# Patient Record
Sex: Female | Born: 1965 | Race: Black or African American | Hispanic: No | Marital: Single | Smoking: Never smoker
Health system: Southern US, Community
[De-identification: ages and names within clinical notes are randomized; demographics above are authoritative.]

## PROBLEM LIST (undated history)

## (undated) DIAGNOSIS — E119 Type 2 diabetes mellitus without complications: Secondary | ICD-10-CM

## (undated) DIAGNOSIS — E785 Hyperlipidemia, unspecified: Secondary | ICD-10-CM

## (undated) DIAGNOSIS — I10 Essential (primary) hypertension: Secondary | ICD-10-CM

## (undated) HISTORY — PX: SHOULDER OPEN ROTATOR CUFF REPAIR: SHX2407

## (undated) HISTORY — DX: Type 2 diabetes mellitus without complications: E11.9

## (undated) HISTORY — DX: Essential (primary) hypertension: I10

## (undated) HISTORY — DX: Hyperlipidemia, unspecified: E78.5

## (undated) HISTORY — PX: REDUCTION MAMMAPLASTY: SUR839

## (undated) HISTORY — PX: BREAST REDUCTION SURGERY: SHX8

---

## 2000-03-11 ENCOUNTER — Other Ambulatory Visit: Admission: RE | Admit: 2000-03-11 | Discharge: 2000-03-11 | Payer: Self-pay | Admitting: Obstetrics and Gynecology

## 2001-02-04 ENCOUNTER — Encounter: Payer: Self-pay | Admitting: Otolaryngology

## 2001-02-04 ENCOUNTER — Encounter: Admission: RE | Admit: 2001-02-04 | Discharge: 2001-02-04 | Payer: Self-pay | Admitting: Otolaryngology

## 2001-03-11 ENCOUNTER — Other Ambulatory Visit: Admission: RE | Admit: 2001-03-11 | Discharge: 2001-03-11 | Payer: Self-pay | Admitting: Obstetrics and Gynecology

## 2002-03-09 ENCOUNTER — Encounter: Payer: Self-pay | Admitting: Otolaryngology

## 2002-03-09 ENCOUNTER — Encounter: Admission: RE | Admit: 2002-03-09 | Discharge: 2002-03-09 | Payer: Self-pay | Admitting: Family Medicine

## 2003-09-18 ENCOUNTER — Ambulatory Visit (HOSPITAL_COMMUNITY): Admission: RE | Admit: 2003-09-18 | Discharge: 2003-09-18 | Payer: Self-pay | Admitting: Specialist

## 2003-09-18 ENCOUNTER — Ambulatory Visit (HOSPITAL_BASED_OUTPATIENT_CLINIC_OR_DEPARTMENT_OTHER): Admission: RE | Admit: 2003-09-18 | Discharge: 2003-09-18 | Payer: Self-pay | Admitting: Specialist

## 2003-09-18 ENCOUNTER — Encounter (INDEPENDENT_AMBULATORY_CARE_PROVIDER_SITE_OTHER): Payer: Self-pay | Admitting: Specialist

## 2004-02-18 ENCOUNTER — Emergency Department (HOSPITAL_COMMUNITY): Admission: EM | Admit: 2004-02-18 | Discharge: 2004-02-18 | Payer: Self-pay | Admitting: Emergency Medicine

## 2005-10-23 ENCOUNTER — Other Ambulatory Visit: Admission: RE | Admit: 2005-10-23 | Discharge: 2005-10-23 | Payer: Self-pay | Admitting: Obstetrics and Gynecology

## 2005-12-07 ENCOUNTER — Emergency Department (HOSPITAL_COMMUNITY): Admission: EM | Admit: 2005-12-07 | Discharge: 2005-12-07 | Payer: Self-pay | Admitting: Emergency Medicine

## 2008-12-20 ENCOUNTER — Encounter: Admission: RE | Admit: 2008-12-20 | Discharge: 2008-12-20 | Payer: Self-pay | Admitting: Obstetrics and Gynecology

## 2010-02-17 ENCOUNTER — Encounter: Payer: Self-pay | Admitting: Family Medicine

## 2010-02-18 ENCOUNTER — Encounter: Payer: Self-pay | Admitting: Radiology

## 2010-02-18 ENCOUNTER — Encounter: Payer: Self-pay | Admitting: Family Medicine

## 2010-06-14 NOTE — Op Note (Signed)
Kathryn Rivera, Kathryn Rivera                           ACCOUNT NO.:  [id number]   MEDICAL RECORD NO.:  [id number]                   PATIENT TYPE:  AMB   LOCATION:  DSC                                  FACILITY:  [facility]   PHYSICIAN:  Earvin Hansen L. Shon Hough, M.D.           DATE OF BIRTH:  Jun 10, 1965   DATE OF PROCEDURE:  09/18/2003  DATE OF DISCHARGE:                                 OPERATIVE REPORT   A 45 year old lady with severe, severe macromastia.  Wears over a 50E-F bra.  Increased back and shoulder pain secondary to large pendulous breasts.  Intertriginous changes resistant to conservative treatment.   PROCEDURES PLANNED:  1. Bilateral breast reductions using the inferior pedicle technique.  2. Excision of accessory breast tissue, large amounts from right and left     axillary and latissimus dorsi areas.   ANESTHESIA:  General.   Preoperatively the patient was sat up and drawn for the inferior pedicle  reduction and mammoplasty.  She then underwent general anesthesia and was  intubated orally.  Prep was done to the chest and breasts areas in routine  fashion using Betadine soap and solution and walled off with sterile towels  and drapes so as to make a sterile field.  Xylocaine 0.25% with epinephrine  1:400,000 concentration was injected locally, a total of 150 mL per side.  The wounds were scored with #15 blade and the skin over the inferior pedicle  was de-epithelized with a #20 blade.  Medial and lateral fatty dermal  pedicles were incised down to underlying fascia out laterally.  More tissue  was removed, as well as large volumes of accessory breast tissue.  After  proper hemostasis, a new keyhole area was debulked and then the flap was  transposed and stayed with 3-0 Prolene.  After proper hemostasis, irrigation  was done showing good hemostasis.  The flaps were then closed subcutaneously  with 3-0 Monocryl x 2 layers and a running subcuticular stitch of 3-0  Monocryl and 5-0  Monocryl throughout the inverted T.  The wounds were  drained with #10 Blake drains fully fluted, placed in the depths of the  wound and brought out through the lateral most portion of the incision and  secured with 3-0 Prolene.  Steri-Strips and a soft dressing were applied,  including Xeroform, 4 x 4s, ABDs and Hypafix tape.  She withstood the  procedures very well, removing almost 2000 g per side.  She was then taken  to recovery in excellent condition.                                               Yaakov Guthrie. Shon Hough, M.D.    Cathie Hoops  D:  09/18/2003  T:  09/18/2003  Job:  [id number]

## 2011-08-25 ENCOUNTER — Other Ambulatory Visit: Payer: Self-pay | Admitting: Obstetrics and Gynecology

## 2011-08-25 DIAGNOSIS — Z139 Encounter for screening, unspecified: Secondary | ICD-10-CM

## 2011-09-11 ENCOUNTER — Ambulatory Visit
Admission: RE | Admit: 2011-09-11 | Discharge: 2011-09-11 | Disposition: A | Discharge status: Home / Self Care | Payer: No Typology Code available for payment source | Source: Ambulatory Visit | Attending: Obstetrics and Gynecology | Admitting: Obstetrics and Gynecology

## 2011-09-11 DIAGNOSIS — Z139 Encounter for screening, unspecified: Secondary | ICD-10-CM

## 2012-10-06 ENCOUNTER — Other Ambulatory Visit: Payer: Self-pay

## 2012-10-06 DIAGNOSIS — Z9889 Other specified postprocedural states: Secondary | ICD-10-CM

## 2012-10-06 DIAGNOSIS — Z1231 Encounter for screening mammogram for malignant neoplasm of breast: Secondary | ICD-10-CM

## 2012-10-29 ENCOUNTER — Ambulatory Visit
Admission: RE | Admit: 2012-10-29 | Discharge: 2012-10-29 | Disposition: A | Discharge status: Home / Self Care | Payer: PRIVATE HEALTH INSURANCE | Source: Ambulatory Visit

## 2012-10-29 DIAGNOSIS — Z9889 Other specified postprocedural states: Secondary | ICD-10-CM

## 2012-10-29 DIAGNOSIS — Z1231 Encounter for screening mammogram for malignant neoplasm of breast: Secondary | ICD-10-CM

## 2013-10-12 ENCOUNTER — Other Ambulatory Visit: Payer: Self-pay | Admitting: Interventional Cardiology

## 2013-10-12 DIAGNOSIS — M545 Low back pain, unspecified: Secondary | ICD-10-CM

## 2014-03-30 ENCOUNTER — Other Ambulatory Visit: Payer: Self-pay

## 2014-03-30 DIAGNOSIS — Z1231 Encounter for screening mammogram for malignant neoplasm of breast: Secondary | ICD-10-CM

## 2014-04-07 ENCOUNTER — Ambulatory Visit
Admission: RE | Admit: 2014-04-07 | Discharge: 2014-04-07 | Disposition: A | Discharge status: Home / Self Care | Payer: PRIVATE HEALTH INSURANCE | Source: Ambulatory Visit

## 2014-04-07 DIAGNOSIS — Z1231 Encounter for screening mammogram for malignant neoplasm of breast: Secondary | ICD-10-CM

## 2015-11-16 ENCOUNTER — Other Ambulatory Visit: Payer: Self-pay | Admitting: Family Medicine

## 2015-11-16 ENCOUNTER — Other Ambulatory Visit (HOSPITAL_COMMUNITY)
Admission: RE | Admit: 2015-11-16 | Discharge: 2015-11-16 | Disposition: A | Discharge status: Home / Self Care | Payer: PRIVATE HEALTH INSURANCE | Source: Ambulatory Visit | Attending: Family Medicine | Admitting: Family Medicine

## 2015-11-16 DIAGNOSIS — Z01411 Encounter for gynecological examination (general) (routine) with abnormal findings: Secondary | ICD-10-CM | POA: Diagnosis present

## 2015-11-16 DIAGNOSIS — Z1151 Encounter for screening for human papillomavirus (HPV): Secondary | ICD-10-CM | POA: Insufficient documentation

## 2015-11-19 LAB — CYTOLOGY - PAP
DIAGNOSIS: NEGATIVE
HPV: NOT DETECTED

## 2016-05-12 ENCOUNTER — Ambulatory Visit (INDEPENDENT_AMBULATORY_CARE_PROVIDER_SITE_OTHER): Payer: Self-pay | Admitting: Orthopedic Surgery

## 2016-05-20 ENCOUNTER — Other Ambulatory Visit: Payer: Self-pay | Admitting: Orthopaedic Surgery

## 2016-05-20 DIAGNOSIS — S8001XA Contusion of right knee, initial encounter: Secondary | ICD-10-CM

## 2016-05-21 ENCOUNTER — Ambulatory Visit
Admission: RE | Admit: 2016-05-21 | Discharge: 2016-05-21 | Disposition: A | Discharge status: Home / Self Care | Payer: PRIVATE HEALTH INSURANCE | Source: Ambulatory Visit | Attending: Orthopaedic Surgery | Admitting: Orthopaedic Surgery

## 2016-05-21 DIAGNOSIS — S8001XA Contusion of right knee, initial encounter: Secondary | ICD-10-CM

## 2016-06-02 ENCOUNTER — Telehealth (INDEPENDENT_AMBULATORY_CARE_PROVIDER_SITE_OTHER): Payer: Self-pay

## 2016-06-02 DIAGNOSIS — M25531 Pain in right wrist: Secondary | ICD-10-CM

## 2016-06-02 DIAGNOSIS — M79605 Pain in left leg: Secondary | ICD-10-CM

## 2016-06-02 NOTE — Telephone Encounter (Signed)
Ok for pre visit xrays

## 2016-06-02 NOTE — Telephone Encounter (Signed)
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  CB# is [phone].

## 2016-06-02 NOTE — Telephone Encounter (Signed)
Please advise. Do you feel that we should see patient first or do you want to proceed with ordering these xrays?

## 2016-06-03 ENCOUNTER — Ambulatory Visit (INDEPENDENT_AMBULATORY_CARE_PROVIDER_SITE_OTHER): Payer: Self-pay

## 2016-06-03 NOTE — Telephone Encounter (Signed)
Done. Patient aware.

## 2016-06-03 NOTE — Addendum Note (Signed)
Addended byPrescott Parma: Yonatan Guitron on: 06/03/2016 09:58 AM   Modules accepted: Orders

## 2016-06-05 ENCOUNTER — Other Ambulatory Visit: Payer: Self-pay | Admitting: Family Medicine

## 2016-06-05 DIAGNOSIS — Z1231 Encounter for screening mammogram for malignant neoplasm of breast: Secondary | ICD-10-CM

## 2016-06-06 ENCOUNTER — Other Ambulatory Visit (INDEPENDENT_AMBULATORY_CARE_PROVIDER_SITE_OTHER): Payer: Self-pay | Admitting: Orthopedic Surgery

## 2016-06-06 ENCOUNTER — Ambulatory Visit
Admission: RE | Admit: 2016-06-06 | Discharge: 2016-06-06 | Disposition: A | Discharge status: Home / Self Care | Payer: PRIVATE HEALTH INSURANCE | Source: Ambulatory Visit | Attending: Family Medicine | Admitting: Family Medicine

## 2016-06-06 ENCOUNTER — Ambulatory Visit
Admission: RE | Admit: 2016-06-06 | Discharge: 2016-06-06 | Disposition: A | Discharge status: Home / Self Care | Payer: PRIVATE HEALTH INSURANCE | Source: Ambulatory Visit | Attending: Orthopedic Surgery | Admitting: Orthopedic Surgery

## 2016-06-06 DIAGNOSIS — Z1231 Encounter for screening mammogram for malignant neoplasm of breast: Secondary | ICD-10-CM

## 2016-06-06 DIAGNOSIS — M25562 Pain in left knee: Secondary | ICD-10-CM

## 2016-06-06 DIAGNOSIS — M25531 Pain in right wrist: Secondary | ICD-10-CM

## 2016-06-09 ENCOUNTER — Ambulatory Visit (INDEPENDENT_AMBULATORY_CARE_PROVIDER_SITE_OTHER): Payer: PRIVATE HEALTH INSURANCE | Admitting: Orthopedic Surgery

## 2016-06-09 DIAGNOSIS — S83271A Complex tear of lateral meniscus, current injury, right knee, initial encounter: Secondary | ICD-10-CM

## 2016-06-09 DIAGNOSIS — M25562 Pain in left knee: Secondary | ICD-10-CM | POA: Diagnosis not present

## 2016-06-09 DIAGNOSIS — M25561 Pain in right knee: Secondary | ICD-10-CM

## 2016-06-09 DIAGNOSIS — M79642 Pain in left hand: Secondary | ICD-10-CM

## 2016-06-09 DIAGNOSIS — M79641 Pain in right hand: Secondary | ICD-10-CM

## 2016-06-11 ENCOUNTER — Encounter (INDEPENDENT_AMBULATORY_CARE_PROVIDER_SITE_OTHER): Payer: Self-pay | Admitting: Orthopedic Surgery

## 2016-06-11 DIAGNOSIS — M25562 Pain in left knee: Secondary | ICD-10-CM | POA: Diagnosis not present

## 2016-06-11 DIAGNOSIS — M25561 Pain in right knee: Secondary | ICD-10-CM | POA: Diagnosis not present

## 2016-06-11 MED ORDER — LIDOCAINE HCL 1 % IJ SOLN
5.0000 mL | INTRAMUSCULAR | Status: AC | PRN
  Administered 2016-06-11: 5 mL

## 2016-06-11 MED ORDER — BUPIVACAINE HCL 0.25 % IJ SOLN
4.0000 mL | INTRAMUSCULAR | Status: AC | PRN
  Administered 2016-06-11: 4 mL via INTRA_ARTICULAR

## 2016-06-11 MED ORDER — METHYLPREDNISOLONE ACETATE 40 MG/ML IJ SUSP
40.0000 mg | INTRAMUSCULAR | Status: AC | PRN
  Administered 2016-06-11: 40 mg via INTRA_ARTICULAR

## 2016-06-11 NOTE — Progress Notes (Signed)
Office Visit Note   Patient: Kathryn Rivera           Date of Birth: 01-28-1966           MRN: [id number] Visit Date: 06/09/2016 Requested by: Shirlean Mylar, MD [address] [place], [place] [address] PCP: Shirlean Mylar, MD  Subjective: Chief Complaint  Patient presents with  . Left Knee - Pain, Injury  . Left Wrist - Pain, Injury  . Left Hand - Pain, Injury    HPI: Kathryn Rivera is a 51 year old patient with bilateral knee pain right worse than left.  Date of injury 05/03/2016 where she fell down steps landing on her knees.  [facility] with [facility] where x-rays were obtained.  She was told to take an anti-inflammatory.  She has since had an MRI scan obtained.  Reports swelling in the knee which comes and goes along with popping and locking.  She fell again 3 weeks ago when her knee buckled and gave way.  MRI scan is reviewed and it shows potentially unstable lateral meniscal tear along with lateral compartment arthritis.  Anterior cruciate ligament is intact.  She also reports bilateral wrist and hand pain right worse than left.  She injured this when she fell and she is taking an anti-inflammatory for that.  She does live up 4 flights of stairs.  No family history or personal history of pulmonary embolism or deep vein thrombosis              ROS: All systems reviewed are negative as they relate to the chief complaint within the history of present illness.  Patient denies  fevers or chills.   Assessment & Plan: Visit Diagnoses:  1. Pain in both knees, unspecified chronicity   2. Pain in both hands     Plan: Impression is right knee pain with lateral meniscal tear in the face of arthritis with definite mechanical locking symptoms.  I reviewed the MRI scan with her.  I think in general her pain would be improved and her mechanical symptoms would be alleviated with partial lateral meniscectomy.  The tear itself does not appear to be repairable.   She will do this in late June.  I did aspirate and inject the knee today.  Risks and benefits of surgery are discussed including not limited to infection the stiffness incomplete pain relief as well as potential need for more surgery down the road which would be partial versus complete knee replacement.  All questions answered.  Her hand and wrist pain looks more like tendinitis and bruising as opposed to anything significant structural or operative  Follow-Up Instructions: No Follow-up on file.   Orders:  No orders of the defined types were placed in this encounter.  No orders of the defined types were placed in this encounter.     Procedures: Large Joint Inj Date/Time: 06/11/2016 8:05 AM Performed by: Cammy Copa Authorized by: Cammy Copa   Consent Given by:  Patient Site marked: the procedure site was marked   Timeout: prior to procedure the correct patient, procedure, and site was verified   Indications:  Pain, joint swelling and diagnostic evaluation Location:  Knee Site:  R knee Prep: patient was prepped and draped in usual sterile fashion   Needle Size:  18 G Needle Length:  1.5 inches Approach:  Superolateral Ultrasound Guidance: No   Fluoroscopic Guidance: No   Arthrogram: No   Medications:  5 mL  lidocaine 1 %; 4 mL bupivacaine 0.25 %; 40 mg methylPREDNISolone acetate 40 MG/ML Aspiration Attempted: Yes   Aspirate amount (mL):  20 Aspirate:  Blood-tinged Patient tolerance:  Patient tolerated the procedure well with no immediate complications     Clinical Data: No additional findings.  Objective: Vital Signs: LMP 05/29/2016 (Exact Date)   Physical Exam:   Constitutional: Patient appears well-developed HEENT:  Head: Normocephalic Eyes:EOM are normal Neck: Normal range of motion Cardiovascular: Normal rate Pulmonary/chest: Effort normal Neurologic: Patient is alert Skin: Skin is warm Psychiatric: Patient has normal mood and  affect    Ortho Exam: Orthopedic exam demonstrates slightly antalgic gait to the right with mild knee effusion the right no effusion in the left.  Collateral and cruciate ligaments are stable in both knees.  Extensor mechanism is intact.  Patient has palpable pedal pulses bilaterally with good ankle dorsi flexion plantar flexion strength.  No other masses lymph adenopathy or skin changes noted in the right knee region.  Both wrists are examined.  There is no snuffbox tenderness present.  No radial tuberosity/styloid tenderness.  Wrist range of motion is full.  Grip strength is intact bilaterally.  Pedal and radial pulses palpable.  She does have some tenderness over the EDC tendon but this does not subluxate with pronation supination of the wrist.  Specialty Comments:  No specialty comments available.  Imaging: No results found.   PMFS History: There are no active problems to display for this patient.  No past medical history on file.  Family History  Problem Relation Age of Onset  . Breast cancer Mother     No past surgical history on file. Social History   Occupational History  . Not on file.   Social History Main Topics  . Smoking status: Not on file  . Smokeless tobacco: Not on file  . Alcohol use Not on file  . Drug use: Unknown  . Sexual activity: Not on file

## 2016-06-19 ENCOUNTER — Ambulatory Visit (INDEPENDENT_AMBULATORY_CARE_PROVIDER_SITE_OTHER): Payer: PRIVATE HEALTH INSURANCE | Admitting: Orthopedic Surgery

## 2016-06-19 ENCOUNTER — Telehealth (INDEPENDENT_AMBULATORY_CARE_PROVIDER_SITE_OTHER): Payer: Self-pay

## 2016-06-19 ENCOUNTER — Telehealth (INDEPENDENT_AMBULATORY_CARE_PROVIDER_SITE_OTHER): Payer: Self-pay | Admitting: Orthopedic Surgery

## 2016-06-19 ENCOUNTER — Encounter (INDEPENDENT_AMBULATORY_CARE_PROVIDER_SITE_OTHER): Payer: Self-pay | Admitting: Orthopedic Surgery

## 2016-06-19 DIAGNOSIS — M25561 Pain in right knee: Secondary | ICD-10-CM | POA: Diagnosis not present

## 2016-06-19 NOTE — Telephone Encounter (Signed)
Per Dr August Saucerean he said can you please change Surgery date from June 11th to June 4th instead.  Thanks.

## 2016-06-19 NOTE — Telephone Encounter (Signed)
LVM with pt to please call to schedule surgery. Will try pt again at a later time. 

## 2016-06-21 DIAGNOSIS — M25561 Pain in right knee: Secondary | ICD-10-CM | POA: Diagnosis not present

## 2016-06-21 MED ORDER — LIDOCAINE HCL 1 % IJ SOLN
5.0000 mL | INTRAMUSCULAR | Status: AC | PRN
  Administered 2016-06-21: 5 mL

## 2016-06-21 MED ORDER — BUPIVACAINE HCL 0.25 % IJ SOLN
4.0000 mL | INTRAMUSCULAR | Status: AC | PRN
  Administered 2016-06-21: 4 mL via INTRA_ARTICULAR

## 2016-06-21 NOTE — Progress Notes (Signed)
Office Visit Note   Patient: Kathryn Rivera           Date of Birth: 1965-04-24           MRN: [id number] Visit Date: 06/19/2016 Requested by: Shirlean MylarWebb, Carol, MD [address] [place], [place] [address] PCP: Shirlean MylarWebb, Carol, MD  Subjective: Chief Complaint  Patient presents with  . Right Knee - Pain, Injury    HPI: Kathryn Rivera is a 51 year old female with right knee pain.  She had an injection on 06/09/2016.  She has no lateral meniscal tear and has been having some mechanical symptoms.  She is scheduled for surgery in the near future.  Since the injection her knee is locked up on 2 occasions.  This causes her extreme pain and inability to weight-bear.  Plan is to try to move her surgery date up.              ROS: All systems reviewed are negative as they relate to the chief complaint within the history of present illness.  Patient denies  fevers or chills.   Assessment & Plan: Visit Diagnoses:  1. Acute pain of right knee     Plan: Impression is mechanical right knee pain with likely locked meniscus bilaterally.  Plan is to inject that knee today with about 30 mL of Marcaine and Toradol to see if we can help her to unlock that knee.  Offered her surgical treatment prior to her surgery date that the best she can do is to move the surgery up to June 4.  We will try to do that to accommodate her.  Follow-Up Instructions: No Follow-up on file.   Orders:  No orders of the defined types were placed in this encounter.  No orders of the defined types were placed in this encounter.     Procedures: Large Joint Inj Date/Time: 06/21/2016 9:34 AM Performed by: Cammy CopaEAN, SCOTT Atley Neubert Authorized by: Cammy CopaEAN, SCOTT Breiona Couvillon   Consent Given by:  Patient Site marked: the procedure site was marked   Timeout: prior to procedure the correct patient, procedure, and site was verified   Indications:  Pain, joint swelling and diagnostic evaluation Location:  Knee Site:  R knee Prep:  patient was prepped and draped in usual sterile fashion   Needle Size:  18 G Needle Length:  1.5 inches Approach:  Superolateral Ultrasound Guidance: No   Fluoroscopic Guidance: No   Arthrogram: No   Medications:  5 mL lidocaine 1 %; 4 mL bupivacaine 0.25 % Patient tolerance:  Patient tolerated the procedure well with no immediate complications     Clinical Data: No additional findings.  Objective: Vital Signs: LMP 05/29/2016 (Exact Date)   Physical Exam:   Constitutional: Patient appears well-developed HEENT:  Head: Normocephalic Eyes:EOM are normal Neck: Normal range of motion Cardiovascular: Normal rate Pulmonary/chest: Effort normal Neurologic: Patient is alert Skin: Skin is warm Psychiatric: Patient has normal mood and affect    Ortho Exam: Orthopedic exam demonstrates loss of the last 15 of extension in the right knee.  Trace effusion is present.  It is primarily lateral sided joint line tenderness.  Flexion is past 90.  Extensor mechanism is intact.  Collateral crucial ligaments are stable.  Specialty Comments:  No specialty comments available.  Imaging: No results found.   PMFS History: There are no active problems to display for this patient.  No past medical history on file.  Family History  Problem Relation Age of Onset  .  Breast cancer Mother     No past surgical history on file. Social History   Occupational History  . Not on file.   Social History Main Topics  . Smoking status: Unknown If Ever Smoked  . Smokeless tobacco: Never Used  . Alcohol use Not on file  . Drug use: Unknown  . Sexual activity: Not on file

## 2016-06-30 ENCOUNTER — Encounter (INDEPENDENT_AMBULATORY_CARE_PROVIDER_SITE_OTHER): Payer: Self-pay | Admitting: Orthopedic Surgery

## 2016-06-30 ENCOUNTER — Ambulatory Visit (INDEPENDENT_AMBULATORY_CARE_PROVIDER_SITE_OTHER): Payer: PRIVATE HEALTH INSURANCE | Admitting: Orthopedic Surgery

## 2016-06-30 DIAGNOSIS — M25561 Pain in right knee: Principal | ICD-10-CM

## 2016-06-30 DIAGNOSIS — G8929 Other chronic pain: Secondary | ICD-10-CM

## 2016-07-02 DIAGNOSIS — M25561 Pain in right knee: Secondary | ICD-10-CM | POA: Diagnosis not present

## 2016-07-02 DIAGNOSIS — G8929 Other chronic pain: Secondary | ICD-10-CM

## 2016-07-02 MED ORDER — LIDOCAINE HCL 1 % IJ SOLN
5.0000 mL | INTRAMUSCULAR | Status: AC | PRN
  Administered 2016-07-02: 5 mL

## 2016-07-02 MED ORDER — BUPIVACAINE HCL 0.25 % IJ SOLN
4.0000 mL | INTRAMUSCULAR | Status: AC | PRN
  Administered 2016-07-02: 4 mL via INTRA_ARTICULAR

## 2016-07-02 NOTE — Progress Notes (Signed)
Office Visit Note   Patient: Kathryn Rivera           Date of Birth: 07-Apr-1965           MRN: [id number] Visit Date: 06/30/2016 Requested by: Shirlean MylarWebb, Carol, MD [address] [place], [place] [address] PCP: Shirlean MylarWebb, Carol, MD  Subjective: Chief Complaint  Patient presents with  . Right Knee - Follow-up, Pain    HPI: Kathryn Rivera is a 51 year old patient with right knee pain.  She states that her right knee locked again will she was out of town this past weekend.  She has a known complex lateral meniscal tear.  She is scheduled for surgery June 11.  She states that her knee locked up yesterday morning and she's been having difficulty moving it since that time.  She has been out of work this week due to the knee locking up.  This happened several weeks ago and a Marcaine and Toradol injection helped the knee to unlock.  Now it is locked again.              ROS: All systems reviewed are negative as they relate to the chief complaint within the history of present illness.  Patient denies  fevers or chills.   Assessment & Plan: Visit Diagnoses:  1. Chronic pain of right knee     Plan: Impression is right knee locked from likely complex lateral meniscal tear.  Plan is to inject that knee again today with 20 mL of Marcaine and 30 mg of Toradol.  We'll see if we can get her through until her surgery date on Monday.  Follow-Up Instructions: No Follow-up on file.   Orders:  No orders of the defined types were placed in this encounter.  No orders of the defined types were placed in this encounter.     Procedures: Large Joint Inj Date/Time: 07/02/2016 11:10 PM Performed by: Cammy CopaEAN, SCOTT Marisella Puccio Authorized by: Cammy CopaEAN, SCOTT Genetta Fiero   Consent Given by:  Patient Site marked: the procedure site was marked   Timeout: prior to procedure the correct patient, procedure, and site was verified   Indications:  Pain, joint swelling and diagnostic evaluation Location:  Knee Site:  R  knee Prep: patient was prepped and draped in usual sterile fashion   Needle Size:  18 G Needle Length:  1.5 inches Approach:  Superolateral Ultrasound Guidance: No   Fluoroscopic Guidance: No   Arthrogram: No   Medications:  5 mL lidocaine 1 %; 4 mL bupivacaine 0.25 % Aspiration Attempted: Yes   Aspirate amount (mL):  15 Aspirate:  Yellow Patient tolerance:  Patient tolerated the procedure well with no immediate complications     Clinical Data: No additional findings.  Objective: Vital Signs: There were no vitals taken for this visit.  Physical Exam:   Constituto right knee demonstrates mild effusion no extension beyond 15 of flexion.  Collateral cruciate ligaments are stable pedal pulses palpable no calf tenderness is present Patient appears well-developed HEENT:  Head: Normocephalic Eyes:EOM are normal Neck: Normal range of motion Cardiovascular: Normal rate Pulmonary/chest: Effort normal Neurologic: Patient is alert Skin: Skin is warm Psychiatric: Patient has normal mood and affect    Ortho Exam:   Specialty Comments:  No specialty comments available.  Imaging: No results found.   PMFS History: There are no active problems to display for this patient.  No past medical history on file.  Family History  Problem Relation Age of Onset  . Breast cancer  Mother     No past surgical history on file. Social History   Occupational History  . Not on file.   Social History Main Topics  . Smoking status: Unknown If Ever Smoked  . Smokeless tobacco: Never Used  . Alcohol use Not on file  . Drug use: Unknown  . Sexual activity: Not on file

## 2016-07-03 ENCOUNTER — Telehealth (INDEPENDENT_AMBULATORY_CARE_PROVIDER_SITE_OTHER): Payer: Self-pay

## 2016-07-03 MED ORDER — OXYCODONE HCL 5 MG PO CAPS: 5.0000 mg | ORAL_CAPSULE | Freq: Four times a day (QID) | ORAL | 0 refills | Status: DC | PRN

## 2016-07-03 NOTE — Addendum Note (Signed)
Addended byPrescott Parma: Vihan Santagata on: 07/03/2016 01:36 PM   Modules accepted: Orders

## 2016-07-03 NOTE — Telephone Encounter (Signed)
Patient called wanting to know if possible could her surgery be moved up or if she could get a Rx for pain until her surgery on Monday.  Stated that her right knee has locked up again and she is not able to move her leg and is having some swelling.  Please Advise.  CB# is [phone].

## 2016-07-03 NOTE — Telephone Encounter (Signed)
S/w Dr August Saucerean. He ok'd rx. I printed and patient may pick up at front desk. Patient is aware.

## 2016-07-03 NOTE — Telephone Encounter (Signed)
Called advised patient Dr August Saucerean not in office until this afternoon. Will ask him about this and call her back. She is asking for something very strong if she cannot have surgery sooner b/c she cannot lay down, she cannot straighten her leg and states it is extremely uncomfortable to sit in a chair.

## 2016-07-07 ENCOUNTER — Encounter (INDEPENDENT_AMBULATORY_CARE_PROVIDER_SITE_OTHER): Payer: Self-pay | Admitting: Orthopedic Surgery

## 2016-07-07 DIAGNOSIS — S83281D Other tear of lateral meniscus, current injury, right knee, subsequent encounter: Secondary | ICD-10-CM | POA: Diagnosis not present

## 2016-07-14 ENCOUNTER — Encounter (INDEPENDENT_AMBULATORY_CARE_PROVIDER_SITE_OTHER): Payer: Self-pay | Admitting: Orthopedic Surgery

## 2016-07-14 ENCOUNTER — Ambulatory Visit (INDEPENDENT_AMBULATORY_CARE_PROVIDER_SITE_OTHER): Payer: PRIVATE HEALTH INSURANCE | Admitting: Orthopedic Surgery

## 2016-07-14 DIAGNOSIS — S838X1D Sprain of other specified parts of right knee, subsequent encounter: Secondary | ICD-10-CM

## 2016-07-17 NOTE — Progress Notes (Signed)
   Post-Op Visit Note   Patient: Kathryn Rivera           Date of Birth: Aug 05, 1965           MRN: [id number] Visit Date: 07/14/2016 PCP: Shirlean MylarWebb, Carol, MD   Assessment & Plan:  Chief Complaint:  Chief Complaint  Patient presents with  . Right Knee - Routine Post Op   Visit Diagnoses: No diagnosis found.  Plan: Kathryn Rivera is a 51 year old patient who is now a week out right knee arthroscopy and debridement of a substantial portion of her lateral meniscus.  She's had no further locking symptoms.  On exam she has flexion past 90.  She is still on crutches.  On her to start some physical therapy and I'll see her back in 2 weeks.  May need to consider possible return to work at that time on a limited basis.  No calf tenderness today.  Therapy prescription provided  Follow-Up Instructions: Return in about 2 weeks (around 07/28/2016).   Orders:  No orders of the defined types were placed in this encounter.  No orders of the defined types were placed in this encounter.   Imaging: No results found.  PMFS History: There are no active problems to display for this patient.  No past medical history on file.  Family History  Problem Relation Age of Onset  . Breast cancer Mother     No past surgical history on file. Social History   Occupational History  . Not on file.   Social History Main Topics  . Smoking status: Unknown If Ever Smoked  . Smokeless tobacco: Never Used  . Alcohol use Not on file  . Drug use: Unknown  . Sexual activity: Not on file

## 2016-07-25 ENCOUNTER — Ambulatory Visit (INDEPENDENT_AMBULATORY_CARE_PROVIDER_SITE_OTHER): Payer: PRIVATE HEALTH INSURANCE | Admitting: Orthopedic Surgery

## 2016-07-25 DIAGNOSIS — S838X1D Sprain of other specified parts of right knee, subsequent encounter: Secondary | ICD-10-CM

## 2016-07-25 MED ORDER — IBUPROFEN-FAMOTIDINE 800-26.6 MG PO TABS: 1.0000 | ORAL_TABLET | Freq: Two times a day (BID) | ORAL | 0 refills | Status: DC

## 2016-07-25 NOTE — Progress Notes (Signed)
   Post-Op Visit Note   Patient: Kathryn Rivera           Date of Birth: [birth year]           MRN: [id number] Visit Date: 07/25/2016 PCP: Kathryn Rivera, Carol, MD   Assessment & Plan:  Chief Complaint:  Chief Complaint  Patient presents with  . Right Knee - Routine Post Op   Visit Diagnoses: No diagnosis found.  Plan: Kathryn Rivera is a patient who is now 3 weeks out right knee arthroscopy debridement of lateral meniscus.  She's been a relating with a single crutch.  On exam she has mild effusion improving range of motion.  I think she can have some trouble with this knee down the road.  Think is okay for her to return to work on Monday half days for the first week and then regular duty thereafter.  Handicap sticker 3 months provided return in 4 weeks need to do rehabilitation consisting of stationary bike for quad strengthening.  I'll see her back in 4 weeks for final check  Follow-Up Instructions: Return in about 4 weeks (around 08/22/2016).   Orders:  No orders of the defined types were placed in this encounter.  Meds ordered this encounter  Medications  . Ibuprofen-Famotidine (DUEXIS) 800-26.6 MG TABS    Sig: Take 1 tablet by mouth 2 (two) times daily.    Dispense:  90 tablet    Refill:  0    Imaging: No results found.  PMFS History: There are no active problems to display for this patient.  No past medical history on file.  Family History  Problem Relation Age of Onset  . Breast cancer Mother     No past surgical history on file. Social History   Occupational History  . Not on file.   Social History Main Topics  . Smoking status: Unknown If Ever Smoked  . Smokeless tobacco: Never Used  . Alcohol use Not on file  . Drug use: Unknown  . Sexual activity: Not on file

## 2016-07-31 ENCOUNTER — Telehealth (INDEPENDENT_AMBULATORY_CARE_PROVIDER_SITE_OTHER): Payer: Self-pay | Admitting: Radiology

## 2016-07-31 NOTE — Telephone Encounter (Signed)
[facility]W for patient was to be 07/28/2016, at half days x 1 week, then regular duty thereafter. Also asked what type surg pt had? Verified right knee scope with debridement and lateral meniscal repair. Also verified surgery performed 07/07/16.  Also verified that reason OOW earlier than surg was due to knee locking. Also verified HEP/stationary bike. Verified next appt 08/22/16.  There is a signed release in the chart for [facility].

## 2016-08-04 ENCOUNTER — Telehealth (INDEPENDENT_AMBULATORY_CARE_PROVIDER_SITE_OTHER): Payer: Self-pay

## 2016-08-04 ENCOUNTER — Encounter (INDEPENDENT_AMBULATORY_CARE_PROVIDER_SITE_OTHER): Payer: Self-pay

## 2016-08-04 NOTE — Telephone Encounter (Signed)
Faxed form back to [facility] with all of restrictions/work status/and OV notes as requested. [phone] attn: Werner LeanShamia

## 2016-08-22 ENCOUNTER — Ambulatory Visit (INDEPENDENT_AMBULATORY_CARE_PROVIDER_SITE_OTHER): Payer: PRIVATE HEALTH INSURANCE | Admitting: Orthopedic Surgery

## 2016-10-02 ENCOUNTER — Ambulatory Visit (INDEPENDENT_AMBULATORY_CARE_PROVIDER_SITE_OTHER): Payer: PRIVATE HEALTH INSURANCE | Admitting: Orthopedic Surgery

## 2016-10-02 ENCOUNTER — Encounter (INDEPENDENT_AMBULATORY_CARE_PROVIDER_SITE_OTHER): Payer: Self-pay | Admitting: Orthopedic Surgery

## 2016-10-02 DIAGNOSIS — S838X1D Sprain of other specified parts of right knee, subsequent encounter: Secondary | ICD-10-CM

## 2016-10-02 NOTE — Progress Notes (Signed)
   Post-Op Visit Note   Patient: Kathryn Rivera           Date of Birth: November 01, 1965           MRN: [id number] Visit Date: 10/02/2016 PCP: Shirlean MylarWebb, Carol, MD   Assessment & Plan:  Chief Complaint:  Chief Complaint  Patient presents with  . Right Knee - Follow-up   Visit Diagnoses: No diagnosis found.  Plan: Kathryn Rivera is a 51 year old patient who is now almost 3 months out right knee arthroscopy and debridement.  She's been doing Has occasional discomfoer knee when she is walking on an incline.  She taflammatory about twice a wee doing stationary bike and physical therapy exercises.  On exam she has about a 7 flexion contracture on the right knee hyperextenfusion in theth is improvi but I wouldn't encourage them as the sole mean of exercise.  She may need injections periodically if her knee flares up.  Op with me as needed  Follow-Up Instructions: Return if symptoms worsen or fail to improve.   Orders:  No orders of the defined types were placed in this encounter.  No orders of the defined types were placed in this encounter.   Imaging: No results found.  PMFS History: There are no active problems to display for this patient.  No past medical history on file.  Family History  Problem Relation Age of Onset  . Breast cancer Mother     No past surgical history on file. Social History   Occupational History  . Not on file.   Social History Main Topics  . Smoking status: Unknown If Ever Smoked  . Smokeless tobacco: Never Used  . Alcohol use Not on file  . Drug use: Unknown  . Sexual activity: Not on file

## 2016-12-11 ENCOUNTER — Other Ambulatory Visit: Payer: Self-pay | Admitting: Orthopedic Surgery

## 2016-12-11 DIAGNOSIS — M25511 Pain in right shoulder: Secondary | ICD-10-CM

## 2016-12-12 ENCOUNTER — Ambulatory Visit
Admission: RE | Admit: 2016-12-12 | Discharge: 2016-12-12 | Disposition: A | Discharge status: Home / Self Care | Payer: PRIVATE HEALTH INSURANCE | Source: Ambulatory Visit | Attending: Orthopedic Surgery | Admitting: Orthopedic Surgery

## 2016-12-12 DIAGNOSIS — M25511 Pain in right shoulder: Secondary | ICD-10-CM

## 2016-12-22 ENCOUNTER — Other Ambulatory Visit: Payer: Self-pay | Admitting: Orthopedic Surgery

## 2016-12-22 DIAGNOSIS — M542 Cervicalgia: Secondary | ICD-10-CM

## 2016-12-24 ENCOUNTER — Ambulatory Visit
Admission: RE | Admit: 2016-12-24 | Discharge: 2016-12-24 | Disposition: A | Discharge status: Home / Self Care | Payer: PRIVATE HEALTH INSURANCE | Source: Ambulatory Visit | Attending: Orthopedic Surgery | Admitting: Orthopedic Surgery

## 2016-12-24 DIAGNOSIS — M542 Cervicalgia: Secondary | ICD-10-CM

## 2017-08-18 LAB — HM MAMMOGRAPHY

## 2019-09-12 ENCOUNTER — Other Ambulatory Visit: Payer: Self-pay

## 2019-09-12 ENCOUNTER — Ambulatory Visit
Admission: RE | Admit: 2019-09-12 | Discharge: 2019-09-12 | Disposition: A | Discharge status: Home / Self Care | Payer: Managed Care, Other (non HMO) | Source: Ambulatory Visit | Attending: Chiropractic Medicine | Admitting: Chiropractic Medicine

## 2019-09-12 ENCOUNTER — Other Ambulatory Visit: Payer: Self-pay | Admitting: Chiropractic Medicine

## 2019-09-12 DIAGNOSIS — M549 Dorsalgia, unspecified: Secondary | ICD-10-CM

## 2019-09-12 DIAGNOSIS — M542 Cervicalgia: Secondary | ICD-10-CM

## 2020-03-22 ENCOUNTER — Other Ambulatory Visit: Payer: Self-pay | Admitting: Family Medicine

## 2020-03-22 DIAGNOSIS — Z1231 Encounter for screening mammogram for malignant neoplasm of breast: Secondary | ICD-10-CM

## 2020-03-27 ENCOUNTER — Ambulatory Visit: Payer: Managed Care, Other (non HMO)

## 2020-03-28 ENCOUNTER — Inpatient Hospital Stay: Admission: RE | Admit: 2020-03-28 | Payer: Managed Care, Other (non HMO) | Source: Ambulatory Visit

## 2020-03-30 ENCOUNTER — Inpatient Hospital Stay: Admission: RE | Admit: 2020-03-30 | Payer: Managed Care, Other (non HMO) | Source: Ambulatory Visit

## 2020-11-15 ENCOUNTER — Other Ambulatory Visit: Payer: Self-pay | Admitting: Family Medicine

## 2021-01-30 ENCOUNTER — Ambulatory Visit (INDEPENDENT_AMBULATORY_CARE_PROVIDER_SITE_OTHER): Payer: Managed Care, Other (non HMO) | Admitting: Nurse Practitioner

## 2021-01-30 ENCOUNTER — Other Ambulatory Visit: Payer: Self-pay

## 2021-01-30 ENCOUNTER — Encounter: Payer: Self-pay | Admitting: Nurse Practitioner

## 2021-01-30 VITALS — BP 158/110 | HR 98 | Temp 98.6°F | Ht 67.5 in | Wt 235.6 lb

## 2021-01-30 DIAGNOSIS — E1169 Type 2 diabetes mellitus with other specified complication: Secondary | ICD-10-CM | POA: Diagnosis not present

## 2021-01-30 DIAGNOSIS — E6609 Other obesity due to excess calories: Secondary | ICD-10-CM | POA: Diagnosis not present

## 2021-01-30 DIAGNOSIS — I10 Essential (primary) hypertension: Secondary | ICD-10-CM

## 2021-01-30 DIAGNOSIS — Z1159 Encounter for screening for other viral diseases: Secondary | ICD-10-CM

## 2021-01-30 DIAGNOSIS — E782 Mixed hyperlipidemia: Secondary | ICD-10-CM

## 2021-01-30 DIAGNOSIS — Z6836 Body mass index (BMI) 36.0-36.9, adult: Secondary | ICD-10-CM

## 2021-01-30 DIAGNOSIS — Z79899 Other long term (current) drug therapy: Secondary | ICD-10-CM

## 2021-01-30 DIAGNOSIS — Z7689 Persons encountering health services in other specified circumstances: Secondary | ICD-10-CM

## 2021-01-30 MED ORDER — PRAVASTATIN SODIUM 40 MG PO TABS: 40.0000 mg | ORAL_TABLET | Freq: Every day | ORAL | 1 refills | Status: DC

## 2021-01-30 MED ORDER — BLOOD GLUCOSE MONITOR KIT: PACK | 0 refills | Status: DC

## 2021-01-30 MED ORDER — METFORMIN HCL ER 500 MG PO TB24: 500.0000 mg | ORAL_TABLET | Freq: Every day | ORAL | 1 refills | Status: DC

## 2021-01-30 MED ORDER — LISINOPRIL-HYDROCHLOROTHIAZIDE 20-25 MG PO TABS: 1.0000 | ORAL_TABLET | Freq: Every day | ORAL | 1 refills | Status: DC

## 2021-01-30 MED ORDER — XIGDUO XR 5-1000 MG PO TB24: 1.0000 | ORAL_TABLET | Freq: Every day | ORAL | 1 refills | Status: DC

## 2021-01-30 NOTE — Progress Notes (Signed)
I,Yamilka J Llittleton,acting as a Education administrator for [employer], FNP.,have documented all relevant documentation on the behalf of Minette Brine, FNP,as directed by  Minette Brine, FNP while in the presence of Minette Brine, [place].   This visit occurred during the SARS-CoV-2 public health emergency.  Safety protocols were in place, including screening questions prior to the visit, additional usage of staff PPE, and extensive cleaning of exam room while observing appropriate contact time as indicated for disinfecting solutions.  Subjective:     Patient ID: Kathryn Rivera , female    DOB: 1965/03/05 , 56 y.o.   MRN: [id number]   Chief Complaint  Patient presents with   Establish Care   Diabetes   Hypertension    HPI  Patient presents today to establish primary care. She had been going to [facility]. Her last visit was in 2021. She reports she has not seen a primary care provider since 2019. She has not had any medicines either she stopped taking them due to having side effects. She works as a Librarian, academic for [employer]. Single. She has 2 children 52 yo and 15 y/o both are healthy.   PMH - diabetes - at least 10 years - medications in the past - took metformin and was not feeling well so off and on. She has seen at Endocrinology - Ozempic caused her fatigue. Januvia - did not do well.  Her HgbA1c was up to 11. She has not been checking her blood sugar due to the type of glucometer.  She had lost weight down from 280. 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 She was on a statin had leg cramps and was unsure if the side effects were related to covid or the med and never restarted.   Wt Readings from Last 3 Encounters: 01/30/21 : 235 lb 9.6 oz (106.9 kg)   She had dropped her weight to 207 lbs  Diabetes She presents for her follow-up diabetic visit. She has type 2 diabetes mellitus. Pertinent negatives for diabetes include no polydipsia, no  polyphagia and no polyuria.  Hypertension    Past Medical History:  Diagnosis Date   Diabetes mellitus without complication ([place])    Hyperlipidemia    Hypertension      Family History  Problem Relation Age of Onset   Hypertension Mother    Diabetes Mother    Breast cancer Mother    Heart attack Mother    Hypertension Father    Stroke Father    Hypertension Maternal Grandfather    Cancer Maternal Grandfather    Hypertension Paternal Grandmother    Stroke Paternal Grandmother      Current Outpatient Medications:    blood glucose meter kit and supplies KIT, Dispense based on patient and insurance preference. Check blood sugar 3 times a day before meals and bedtime, Disp: 1 each, Rfl: 0   dapagliflozin propanediol (FARXIGA) 10 MG TABS tablet, Take 1 tablet (10 mg total) by mouth daily before breakfast., Disp: 90 tablet, Rfl: 1   lisinopril-hydrochlorothiazide (ZESTORETIC) 20-25 MG tablet, Take 1 tablet by mouth daily., Disp: 90 tablet, Rfl: 1   metFORMIN (GLUCOPHAGE XR) 750 MG 24 hr tablet, Take 1 tablet (750 mg total) by mouth daily with breakfast., Disp: 90 tablet, Rfl: 1   pravastatin (PRAVACHOL) 40 MG tablet, Take 1 tablet (40 mg total) by mouth daily., Disp: 90 tablet, Rfl: 1   No Known Allergies   Review of Systems  Constitutional: Negative.   Eyes: Negative.   Endocrine: Negative for polydipsia, polyphagia and polyuria.  Musculoskeletal: Negative.   Skin: Negative.   Psychiatric/Behavioral: Negative.      Today's Vitals   01/30/21 1158  BP: (!) 158/110  Pulse: 98  Temp: 98.6 F (37 C)  Weight: 235 lb 9.6 oz (106.9 kg)  Height: 5' 7.5" (1.715 m)  PainSc: 0-No pain   Body mass index is 36.36 kg/m.   Objective:  Physical Exam Constitutional:      General: She is not in acute distress.    Appearance: Normal appearance. She is well-developed. She is obese.  Cardiovascular:     Rate and Rhythm: Normal rate and regular rhythm.     Pulses: Normal pulses.      Heart sounds: Normal heart sounds. No murmur heard. Pulmonary:     Effort: Pulmonary effort is normal.     Breath sounds: Normal breath sounds.  Chest:     Chest wall: No tenderness.  Musculoskeletal:        General: Normal range of motion.  Skin:    General: Skin is warm and dry.     Capillary Refill: Capillary refill takes less than 2 seconds.  Neurological:     General: No focal deficit present.     Mental Status: She is alert and oriented to person, place, and time.  Psychiatric:        Mood and Affect: Mood normal.        Behavior: Behavior normal.        Thought Content: Thought content normal.        Judgment: Judgment normal.        Assessment And Plan:     1. Essential hypertension Comments: Blood pressure is elevated, this is her first visit to the office. She is to take her blood pressure medication as directed. She will start farxiga may help BP - EKG 12-Lead - lisinopril-hydrochlorothiazide (ZESTORETIC) 20-25 MG tablet; Take 1 tablet by mouth daily.  Dispense: 90 tablet; Refill: 1 - CMP14+EGFR  2. Mixed hyperlipidemia Comments: Will check lipid panel, currently taking statin and tolerating well.  - Lipid panel - pravastatin (PRAVACHOL) 40 MG tablet; Take 1 tablet (40 mg total) by mouth daily.  Dispense: 90 tablet; Refill: 1  3. Type 2 diabetes mellitus with other specified complication, unspecified whether long term insulin use (HCC) Comments: She has not been as consistent with her medications. HgB1c has been elevated and will consider adding a medication to her regimen. Discussed risk for complicati - Hemoglobin M3N - Lipid panel - blood glucose meter kit and supplies KIT; Dispense based on patient and insurance preference. Check blood sugar 3 times a day before meals and bedtime  Dispense: 1 each; Refill: 0  4. Class 2 obesity due to excess calories with body mass index (BMI) of 36.0 to 36.9 in adult, unspecified whether serious comorbidity present She is  encouraged to strive for BMI less than 30 to decrease cardiac risk. Advised to aim for at least 150 minutes of exercise per week.  5. Other long term (current) drug therapy - CBC - TSH  6. Encounter for hepatitis C screening test for low risk patient Will check Hepatitis C screening due to recent recommendations to screen all adults 18 years and older - Hepatitis C antibody  7. Establishing care with new doctor, encounter for     Patient was given opportunity to ask questions. Patient verbalized understanding of the plan and was able to  repeat key elements of the plan. All questions were answered to their satisfaction.  Minette Brine, FNP   I, Minette Brine, FNP, have reviewed all documentation for this visit. The documentation on 01/30/21 for the exam, diagnosis, procedures, and orders are all accurate and complete.   IF YOU HAVE BEEN REFERRED TO A SPECIALIST, IT MAY TAKE 1-2 WEEKS TO SCHEDULE/PROCESS THE REFERRAL. IF YOU HAVE NOT HEARD FROM US/SPECIALIST IN TWO WEEKS, PLEASE GIVE [place] A CALL AT [phone].   THE PATIENT IS ENCOURAGED TO PRACTICE SOCIAL DISTANCING DUE TO THE COVID-19 PANDEMIC.

## 2021-01-30 NOTE — Patient Instructions (Signed)

## 2021-01-31 ENCOUNTER — Other Ambulatory Visit: Payer: Self-pay | Admitting: Nurse Practitioner

## 2021-01-31 ENCOUNTER — Other Ambulatory Visit: Payer: Self-pay

## 2021-01-31 DIAGNOSIS — E1169 Type 2 diabetes mellitus with other specified complication: Secondary | ICD-10-CM

## 2021-01-31 LAB — CMP14+EGFR
ALT: 22 IU/L (ref 0–32)
AST: 18 IU/L (ref 0–40)
Albumin/Globulin Ratio: 1.4 (ref 1.2–2.2)
Albumin: 4.4 g/dL (ref 3.8–4.9)
Alkaline Phosphatase: 102 IU/L (ref 44–121)
BUN/Creatinine Ratio: 22 (ref 9–23)
BUN: 14 mg/dL (ref 6–24)
Bilirubin Total: 0.4 mg/dL (ref 0.0–1.2)
CO2: 21 mmol/L (ref 20–29)
Calcium: 9.9 mg/dL (ref 8.7–10.2)
Chloride: 96 mmol/L (ref 96–106)
Creatinine, Ser: 0.65 mg/dL (ref 0.57–1.00)
Globulin, Total: 3.1 g/dL (ref 1.5–4.5)
Glucose: 280 mg/dL — ABNORMAL HIGH (ref 70–99)
Potassium: 4 mmol/L (ref 3.5–5.2)
Sodium: 134 mmol/L (ref 134–144)
Total Protein: 7.5 g/dL (ref 6.0–8.5)
eGFR: 105 mL/min/{1.73_m2} (ref >59)

## 2021-01-31 LAB — CBC
Hematocrit: 43.1 % (ref 34.0–46.6)
Hemoglobin: 13.4 g/dL (ref 11.1–15.9)
MCH: 21.4 pg — ABNORMAL LOW (ref 26.6–33.0)
MCHC: 31.1 g/dL — ABNORMAL LOW (ref 31.5–35.7)
MCV: 69 fL — ABNORMAL LOW (ref 79–97)
Platelets: 312 10*3/uL (ref 150–450)
RBC: 6.26 x10E6/uL — ABNORMAL HIGH (ref 3.77–5.28)
RDW: 15 % (ref 11.7–15.4)
WBC: 4.4 10*3/uL (ref 3.4–10.8)

## 2021-01-31 LAB — LIPID PANEL
Chol/HDL Ratio: 4.5 ratio — ABNORMAL HIGH (ref 0.0–4.4)
Cholesterol, Total: 260 mg/dL — ABNORMAL HIGH (ref 100–199)
HDL: 58 mg/dL (ref >39)
LDL Chol Calc (NIH): 154 mg/dL — ABNORMAL HIGH (ref 0–99)
Triglycerides: 262 mg/dL — ABNORMAL HIGH (ref 0–149)
VLDL Cholesterol Cal: 48 mg/dL — ABNORMAL HIGH (ref 5–40)

## 2021-01-31 LAB — TSH: TSH: 1.46 u[IU]/mL (ref 0.450–4.500)

## 2021-01-31 LAB — HEMOGLOBIN A1C
Est. average glucose Bld gHb Est-mCnc: 312 mg/dL
Hgb A1c MFr Bld: 12.5 % — ABNORMAL HIGH (ref 4.8–5.6)

## 2021-01-31 LAB — HEPATITIS C ANTIBODY: Hep C Virus Ab: <0.1 s/co ratio (ref 0.0–0.9)

## 2021-01-31 MED ORDER — METFORMIN HCL ER 750 MG PO TB24: 750.0000 mg | ORAL_TABLET | Freq: Every day | ORAL | 1 refills | Status: DC

## 2021-01-31 MED ORDER — DAPAGLIFLOZIN PROPANEDIOL 10 MG PO TABS: 10.0000 mg | ORAL_TABLET | Freq: Every day | ORAL | 1 refills | Status: DC

## 2021-01-31 MED ORDER — XIGDUO XR 5-1000 MG PO TB24: 1.0000 | ORAL_TABLET | Freq: Every day | ORAL | 1 refills | Status: DC

## 2021-02-03 ENCOUNTER — Encounter: Payer: Self-pay | Admitting: Nurse Practitioner

## 2021-03-28 ENCOUNTER — Ambulatory Visit: Payer: PRIVATE HEALTH INSURANCE | Admitting: Nurse Practitioner

## 2021-03-29 ENCOUNTER — Other Ambulatory Visit: Payer: Self-pay | Admitting: Nurse Practitioner

## 2021-03-29 DIAGNOSIS — Z1231 Encounter for screening mammogram for malignant neoplasm of breast: Secondary | ICD-10-CM

## 2021-04-04 ENCOUNTER — Ambulatory Visit
Admission: RE | Admit: 2021-04-04 | Discharge: 2021-04-04 | Disposition: A | Discharge status: Home / Self Care | Payer: Managed Care, Other (non HMO) | Source: Ambulatory Visit | Attending: Nurse Practitioner | Admitting: Nurse Practitioner

## 2021-04-04 DIAGNOSIS — Z1231 Encounter for screening mammogram for malignant neoplasm of breast: Secondary | ICD-10-CM

## 2021-05-23 ENCOUNTER — Encounter: Payer: PRIVATE HEALTH INSURANCE | Admitting: Nurse Practitioner

## 2021-05-23 ENCOUNTER — Other Ambulatory Visit: Payer: Self-pay | Admitting: Specialist

## 2021-05-23 DIAGNOSIS — M5412 Radiculopathy, cervical region: Secondary | ICD-10-CM

## 2021-06-14 ENCOUNTER — Telehealth: Payer: Self-pay

## 2021-06-14 NOTE — Telephone Encounter (Signed)
Lvm for pt to return call to schedule an appt.

## 2021-07-17 ENCOUNTER — Ambulatory Visit: Payer: PRIVATE HEALTH INSURANCE | Admitting: Nurse Practitioner

## 2021-08-27 ENCOUNTER — Ambulatory Visit: Payer: Managed Care, Other (non HMO) | Admitting: Nurse Practitioner

## 2021-08-27 ENCOUNTER — Other Ambulatory Visit: Payer: Self-pay | Admitting: Nurse Practitioner

## 2021-08-27 ENCOUNTER — Encounter: Payer: Self-pay | Admitting: Nurse Practitioner

## 2021-08-27 VITALS — BP 142/90 | HR 89 | Temp 98.1°F | Ht 67.0 in | Wt 233.8 lb

## 2021-08-27 DIAGNOSIS — I10 Essential (primary) hypertension: Secondary | ICD-10-CM | POA: Diagnosis not present

## 2021-08-27 DIAGNOSIS — Z0000 Encounter for general adult medical examination without abnormal findings: Secondary | ICD-10-CM

## 2021-08-27 DIAGNOSIS — Z6836 Body mass index (BMI) 36.0-36.9, adult: Secondary | ICD-10-CM

## 2021-08-27 DIAGNOSIS — E6609 Other obesity due to excess calories: Secondary | ICD-10-CM

## 2021-08-27 DIAGNOSIS — E1165 Type 2 diabetes mellitus with hyperglycemia: Secondary | ICD-10-CM | POA: Diagnosis not present

## 2021-08-27 DIAGNOSIS — E782 Mixed hyperlipidemia: Secondary | ICD-10-CM | POA: Diagnosis not present

## 2021-08-27 DIAGNOSIS — Z1211 Encounter for screening for malignant neoplasm of colon: Secondary | ICD-10-CM

## 2021-08-27 LAB — POCT URINALYSIS DIPSTICK
Bilirubin, UA: NEGATIVE
Glucose, UA: POSITIVE — AB
Ketones, UA: 15
Leukocytes, UA: NEGATIVE
Nitrite, UA: NEGATIVE
Protein, UA: NEGATIVE
Spec Grav, UA: 1.02 (ref 1.010–1.025)
Urobilinogen, UA: 0.2 E.U./dL
pH, UA: 5.5 (ref 5.0–8.0)

## 2021-08-27 MED ORDER — PRAVASTATIN SODIUM 40 MG PO TABS: 40.0000 mg | ORAL_TABLET | Freq: Every day | ORAL | 1 refills | Status: DC

## 2021-08-27 MED ORDER — DAPAGLIFLOZIN PROPANEDIOL 10 MG PO TABS: 10.0000 mg | ORAL_TABLET | Freq: Every day | ORAL | 1 refills | Status: DC

## 2021-08-27 MED ORDER — LISINOPRIL-HYDROCHLOROTHIAZIDE 20-25 MG PO TABS: 1.0000 | ORAL_TABLET | Freq: Every day | ORAL | 1 refills | Status: DC

## 2021-08-27 NOTE — Progress Notes (Signed)
I,Kathryn Rivera,acting as a Neurosurgeon for Kathryn Felts, FNP.,have documented all relevant documentation on the behalf of Kathryn Felts, FNP,as directed by  Kathryn Felts, FNP while in the presence of Kathryn Felts, FNP.   Subjective:     Patient ID: Kathryn Rivera , female    DOB: 1965/12/19 , 56 y.o.   MRN: [id number]   No chief complaint on file.   HPI  Pt here for HM. She is followed by Georgianne Fick for GYN. She will make appt for pap. She reports compliance with meds. BP med taken every morning.  She is going to [facility] for her fractured toe for 6 weeks she is due for another xray will check to see if they can send to [facility].   Diabetes She presents for her follow-up diabetic visit. She has type 2 diabetes mellitus. Her disease course has been worsening. Associated symptoms include blurred vision. Pertinent negatives for diabetes include no chest pain, no fatigue, no polydipsia, no polyphagia, no polyuria and no visual change.     Past Medical History:  Diagnosis Date   Diabetes mellitus without complication (HCC)    Hyperlipidemia    Hypertension      Family History  Problem Relation Age of Onset   Hypertension Mother    Diabetes Mother    Breast cancer Mother    Heart attack Mother    Hypertension Father    Stroke Father    Hypertension Maternal Grandfather    Cancer Maternal Grandfather    Hypertension Paternal Grandmother    Stroke Paternal Grandmother      Current Outpatient Medications:    blood glucose meter kit and supplies KIT, Dispense based on patient and insurance preference. Check blood sugar 3 times a day before meals and bedtime, Disp: 1 each, Rfl: 0   metFORMIN (GLUCOPHAGE XR) 750 MG 24 hr tablet, Take 1 tablet (750 mg total) by mouth daily with breakfast., Disp: 90 tablet, Rfl: 1   Continuous Blood Gluc Sensor (DEXCOM G7 SENSOR) MISC, Use to check blood sugar 3 times a day. Dx code e11.65, Disp: 3 each, Rfl: 3   dapagliflozin  propanediol (FARXIGA) 10 MG TABS tablet, Take 1 tablet (10 mg total) by mouth daily before breakfast., Disp: 90 tablet, Rfl: 1   insulin degludec (TRESIBA FLEXTOUCH) 100 UNIT/ML FlexTouch Pen, Inject 20 Units into the skin daily. Increase by 2 units every 3 days until fasting below 130, Disp: 15 mL, Rfl: 2   Insulin Pen Needle (NOVOFINE PLUS PEN NEEDLE) 32G X 4 MM MISC, 1 each by Does not apply route daily. Use with Evaristo Bury, Disp: 200 each, Rfl: 2   lisinopril-hydrochlorothiazide (ZESTORETIC) 20-25 MG tablet, Take 1 tablet by mouth daily., Disp: 90 tablet, Rfl: 1   pravastatin (PRAVACHOL) 40 MG tablet, Take 1 tablet (40 mg total) by mouth daily., Disp: 90 tablet, Rfl: 1   No Known Allergies    The patient states she has had an ablation for birth control. Negative for: breast discharge, breast lump(s), breast pain and breast self exam. Associated symptoms include abnormal vaginal bleeding. Pertinent negatives include abnormal bleeding (hematology), anxiety, decreased libido, depression, difficulty falling sleep, dyspareunia, history of infertility, nocturia, sexual dysfunction, sleep disturbances, urinary incontinence, urinary urgency, vaginal discharge and vaginal itching. Diet regular. The patient states her exercise level is none, she has a fractured toe for 3 weeks [place]   The patient's tobacco use is:  Social History  Tobacco Use  Smoking Status Never   Passive exposure: Never  Smokeless Tobacco Never   She has been exposed to passive smoke. The patient's alcohol use is:  Social History   Substance and Sexual Activity  Alcohol Use Never     Review of Systems  Constitutional: Negative.  Negative for fatigue.  HENT: Negative.    Eyes:  Positive for blurred vision.  Respiratory: Negative.    Cardiovascular: Negative.  Negative for chest pain.  Gastrointestinal: Negative.   Endocrine: Negative.  Negative for polydipsia, polyphagia and  polyuria.  Genitourinary: Negative.   Musculoskeletal: Negative.   Skin: Negative.   Allergic/Immunologic: Negative.   Neurological: Negative.   Hematological: Negative.   Psychiatric/Behavioral: Negative.       Today's Vitals   08/27/21 1555  BP: (!) 142/90  Pulse: 89  Temp: 98.1 F (36.7 C)  SpO2: 98%  Weight: 233 lb 12.8 oz (106.1 kg)  Height: $Remove'5\' 7"'UtOGCuD$  (1.702 m)  PainSc: 0-No pain   Body mass index is 36.62 kg/m.  Wt Readings from Last 3 Encounters:  08/27/21 233 lb 12.8 oz (106.1 kg)  01/30/21 235 lb 9.6 oz (106.9 kg)    Objective:  Physical Exam Vitals reviewed.  Constitutional:      General: She is not in acute distress.    Appearance: Normal appearance. She is well-developed. She is obese.  HENT:     Head: Normocephalic and atraumatic.     Right Ear: Hearing, tympanic membrane, ear canal and external ear normal. There is no impacted cerumen.     Left Ear: Hearing, tympanic membrane, ear canal and external ear normal. There is no impacted cerumen.     Nose: Nose normal.     Mouth/Throat:     Mouth: Mucous membranes are moist.  Eyes:     General: Lids are normal.     Extraocular Movements: Extraocular movements intact.     Conjunctiva/sclera: Conjunctivae normal.     Pupils: Pupils are equal, round, and reactive to light.     Funduscopic exam:    Right eye: No papilledema.        Left eye: No papilledema.  Neck:     Thyroid: No thyroid mass.     Vascular: No carotid bruit.  Cardiovascular:     Rate and Rhythm: Normal rate and regular rhythm.     Pulses: Normal pulses.     Heart sounds: Normal heart sounds. No murmur heard. Pulmonary:     Effort: Pulmonary effort is normal.     Breath sounds: Normal breath sounds.  Chest:     Chest wall: No mass.  Breasts:    Tanner Score is 5.     Right: Normal. No mass or tenderness.     Left: Normal. No mass or tenderness.  Abdominal:     General: Abdomen is flat. Bowel sounds are normal. There is no distension.      Palpations: Abdomen is soft.     Tenderness: There is no abdominal tenderness.  Musculoskeletal:        General: No swelling or tenderness. Normal range of motion.     Cervical back: Full passive range of motion without pain, normal range of motion and neck supple.     Right lower leg: No edema.     Left lower leg: No edema.  Lymphadenopathy:     Upper Body:     Right upper body: No supraclavicular, axillary or pectoral adenopathy.     Left upper body: No  supraclavicular, axillary or pectoral adenopathy.  Skin:    General: Skin is warm and dry.     Capillary Refill: Capillary refill takes less than 2 seconds.  Neurological:     General: No focal deficit present.     Mental Status: She is alert and oriented to person, place, and time.     Cranial Nerves: No cranial nerve deficit.     Sensory: No sensory deficit.  Psychiatric:        Mood and Affect: Mood normal.        Behavior: Behavior normal.        Thought Content: Thought content normal.        Judgment: Judgment normal.         Assessment And Plan:     1. Encounter for annual health examination Behavior modifications discussed and diet history reviewed.   Pt will continue to exercise regularly and modify diet with low GI, plant based foods and decrease intake of processed foods.  Recommend intake of daily multivitamin, Vitamin D, and calcium.  Recommend mammogram and colonoscopy for preventive screenings, as well as recommend immunizations that include influenza, TDAP, and Shingles  2. Uncontrolled type 2 diabetes mellitus with hyperglycemia ([place]) Comments: Elevated at last visit. Will set her up with [place] diabetes educator to get a better understanding of the diseas process.  - POCT Urinalysis Dipstick (81002) - EKG 12-Lead - CMP14+EGFR - CBC - Hemoglobin A1c - dapagliflozin propanediol (FARXIGA) 10 MG TABS tablet; Take 1 tablet (10 mg total) by mouth daily before breakfast.  Dispense: 90 tablet; Refill: 1 -  Ambulatory referral to Ophthalmology  3. Essential hypertension Comments: Blood pressure is elevated, advised to take her blood pressure medications as directed and eat a low salt diet.  - lisinopril-hydrochlorothiazide (ZESTORETIC) 20-25 MG tablet; Take 1 tablet by mouth daily.  Dispense: 90 tablet; Refill: 1  4. Mixed hyperlipidemia Comments: Continue statin, tolerating well.  - POCT Urinalysis Dipstick (81002) - EKG 12-Lead - Lipid panel - pravastatin (PRAVACHOL) 40 MG tablet; Take 1 tablet (40 mg total) by mouth daily.  Dispense: 90 tablet; Refill: 1  5. Class 2 obesity due to excess calories with body mass index (BMI) of 36.0 to 36.9 in adult, unspecified whether serious comorbidity present She is encouraged to strive for BMI less than 30 to decrease cardiac risk. Advised to aim for at least 150 minutes of exercise per week.  6. Encounter for screening colonoscopy According to USPTF Colorectal cancer Screening guidelines. Colonoscopy is recommended every 10 years, starting at age 87years. Will refer to GI for colon cancer screening - Ambulatory referral to Gastroenterology    Patient was given opportunity to ask questions. Patient verbalized understanding of the plan and was able to repeat key elements of the plan. All questions were answered to their satisfaction.   Minette Brine, FNP   I, Minette Brine, FNP, have reviewed all documentation for this visit. The documentation on 08/27/21 for the exam, diagnosis, procedures, and orders are all accurate and complete.   THE PATIENT IS ENCOURAGED TO PRACTICE SOCIAL DISTANCING DUE TO THE COVID-19 PANDEMIC.

## 2021-08-27 NOTE — Patient Instructions (Addendum)

## 2021-08-28 LAB — CBC
Hematocrit: 43.2 % (ref 34.0–46.6)
Hemoglobin: 13.6 g/dL (ref 11.1–15.9)
MCH: 21.8 pg — ABNORMAL LOW (ref 26.6–33.0)
MCHC: 31.5 g/dL (ref 31.5–35.7)
MCV: 69 fL — ABNORMAL LOW (ref 79–97)
Platelets: 278 10*3/uL (ref 150–450)
RBC: 6.23 x10E6/uL — ABNORMAL HIGH (ref 3.77–5.28)
RDW: 16.3 % — ABNORMAL HIGH (ref 11.7–15.4)
WBC: 4.8 10*3/uL (ref 3.4–10.8)

## 2021-08-28 LAB — CMP14+EGFR
ALT: 10 IU/L (ref 0–32)
AST: 10 IU/L (ref 0–40)
Albumin/Globulin Ratio: 1.7 (ref 1.2–2.2)
Albumin: 4.5 g/dL (ref 3.8–4.9)
Alkaline Phosphatase: 82 IU/L (ref 44–121)
BUN/Creatinine Ratio: 21 (ref 9–23)
BUN: 14 mg/dL (ref 6–24)
Bilirubin Total: 0.4 mg/dL (ref 0.0–1.2)
CO2: 23 mmol/L (ref 20–29)
Calcium: 9.5 mg/dL (ref 8.7–10.2)
Chloride: 97 mmol/L (ref 96–106)
Creatinine, Ser: 0.67 mg/dL (ref 0.57–1.00)
Globulin, Total: 2.6 g/dL (ref 1.5–4.5)
Glucose: 134 mg/dL — ABNORMAL HIGH (ref 70–99)
Potassium: 3.8 mmol/L (ref 3.5–5.2)
Sodium: 139 mmol/L (ref 134–144)
Total Protein: 7.1 g/dL (ref 6.0–8.5)
eGFR: 104 mL/min/{1.73_m2} (ref >59)

## 2021-08-28 LAB — LIPID PANEL
Chol/HDL Ratio: 5.4 ratio — ABNORMAL HIGH (ref 0.0–4.4)
Cholesterol, Total: 285 mg/dL — ABNORMAL HIGH (ref 100–199)
HDL: 53 mg/dL (ref >39)
LDL Chol Calc (NIH): 192 mg/dL — ABNORMAL HIGH (ref 0–99)
Triglycerides: 212 mg/dL — ABNORMAL HIGH (ref 0–149)
VLDL Cholesterol Cal: 40 mg/dL (ref 5–40)

## 2021-08-28 LAB — HEMOGLOBIN A1C
Est. average glucose Bld gHb Est-mCnc: 292 mg/dL
Hgb A1c MFr Bld: 11.8 % — ABNORMAL HIGH (ref 4.8–5.6)

## 2021-09-03 ENCOUNTER — Other Ambulatory Visit: Payer: Self-pay

## 2021-09-03 ENCOUNTER — Other Ambulatory Visit: Payer: Self-pay | Admitting: Nurse Practitioner

## 2021-09-03 DIAGNOSIS — E1165 Type 2 diabetes mellitus with hyperglycemia: Secondary | ICD-10-CM

## 2021-09-03 MED ORDER — NOVOFINE PLUS PEN NEEDLE 32G X 4 MM MISC: 1.0000 | Freq: Every day | 2 refills | Status: DC

## 2021-09-03 MED ORDER — DEXCOM G7 SENSOR MISC
3 refills | Status: DC
Start: 2021-09-03 — End: 2022-04-10

## 2021-09-03 MED ORDER — TRESIBA FLEXTOUCH 100 UNIT/ML ~~LOC~~ SOPN: 20.0000 [IU] | PEN_INJECTOR | Freq: Every day | SUBCUTANEOUS | 2 refills | Status: DC

## 2021-09-05 ENCOUNTER — Other Ambulatory Visit: Payer: Self-pay | Admitting: Orthopedic Surgery

## 2021-09-05 ENCOUNTER — Ambulatory Visit
Admission: RE | Admit: 2021-09-05 | Discharge: 2021-09-05 | Disposition: A | Discharge status: Home / Self Care | Payer: Managed Care, Other (non HMO) | Source: Ambulatory Visit | Attending: Orthopedic Surgery | Admitting: Orthopedic Surgery

## 2021-09-05 ENCOUNTER — Encounter: Payer: Self-pay | Admitting: Nurse Practitioner

## 2021-09-05 DIAGNOSIS — S92512D Displaced fracture of proximal phalanx of left lesser toe(s), subsequent encounter for fracture with routine healing: Secondary | ICD-10-CM

## 2021-09-10 ENCOUNTER — Ambulatory Visit: Payer: PRIVATE HEALTH INSURANCE

## 2021-09-17 ENCOUNTER — Ambulatory Visit: Payer: PRIVATE HEALTH INSURANCE

## 2021-10-13 ENCOUNTER — Encounter: Payer: Self-pay | Admitting: Nurse Practitioner

## 2021-10-14 ENCOUNTER — Other Ambulatory Visit: Payer: Self-pay | Admitting: Nurse Practitioner

## 2021-10-14 DIAGNOSIS — E1165 Type 2 diabetes mellitus with hyperglycemia: Secondary | ICD-10-CM

## 2021-10-14 MED ORDER — MOUNJARO 5 MG/0.5ML ~~LOC~~ SOAJ: 5.0000 mg | SUBCUTANEOUS | 1 refills | Status: DC

## 2021-10-15 ENCOUNTER — Ambulatory Visit: Payer: Managed Care, Other (non HMO)

## 2021-10-15 VITALS — BP 160/98 | HR 109 | Temp 98.4°F

## 2021-10-15 DIAGNOSIS — E1165 Type 2 diabetes mellitus with hyperglycemia: Secondary | ICD-10-CM

## 2021-10-15 NOTE — Progress Notes (Signed)
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 Pt advised that she could inject in her abdomen or thigh. Instructions given to pull off gray cap, place base of pen needle to skin then unlock. Pt should then press and hold purple injection button to administer medication. Pt advised to watch diet. Possible side effects explained to patient.   Reached out to provider about patients blood pressure. Pt states that she has to leave to get back to work.

## 2021-11-27 ENCOUNTER — Ambulatory Visit: Payer: PRIVATE HEALTH INSURANCE | Admitting: Nurse Practitioner

## 2021-12-10 ENCOUNTER — Ambulatory Visit: Payer: Managed Care, Other (non HMO) | Admitting: Nurse Practitioner

## 2021-12-10 ENCOUNTER — Encounter: Payer: Self-pay | Admitting: Nurse Practitioner

## 2021-12-10 VITALS — BP 122/88 | HR 99 | Temp 98.1°F | Ht 67.0 in | Wt 234.0 lb

## 2021-12-10 DIAGNOSIS — E1165 Type 2 diabetes mellitus with hyperglycemia: Secondary | ICD-10-CM

## 2021-12-10 DIAGNOSIS — D649 Anemia, unspecified: Secondary | ICD-10-CM

## 2021-12-10 DIAGNOSIS — Z23 Encounter for immunization: Secondary | ICD-10-CM

## 2021-12-10 DIAGNOSIS — Z6836 Body mass index (BMI) 36.0-36.9, adult: Secondary | ICD-10-CM

## 2021-12-10 DIAGNOSIS — Z79899 Other long term (current) drug therapy: Secondary | ICD-10-CM

## 2021-12-10 DIAGNOSIS — I10 Essential (primary) hypertension: Secondary | ICD-10-CM

## 2021-12-10 DIAGNOSIS — Z01419 Encounter for gynecological examination (general) (routine) without abnormal findings: Secondary | ICD-10-CM

## 2021-12-10 DIAGNOSIS — E782 Mixed hyperlipidemia: Secondary | ICD-10-CM | POA: Diagnosis not present

## 2021-12-10 DIAGNOSIS — E6609 Other obesity due to excess calories: Secondary | ICD-10-CM

## 2021-12-10 MED ORDER — MOUNJARO 7.5 MG/0.5ML ~~LOC~~ SOAJ: 7.5000 mg | SUBCUTANEOUS | 3 refills | Status: DC

## 2021-12-10 MED ORDER — TRESIBA FLEXTOUCH 100 UNIT/ML ~~LOC~~ SOPN: 20.0000 [IU] | PEN_INJECTOR | Freq: Every day | SUBCUTANEOUS | 2 refills | Status: DC

## 2021-12-10 MED ORDER — OLMESARTAN MEDOXOMIL-HCTZ 40-25 MG PO TABS: 1.0000 | ORAL_TABLET | Freq: Every day | ORAL | 2 refills | Status: DC

## 2021-12-10 NOTE — Patient Instructions (Signed)

## 2021-12-10 NOTE — Progress Notes (Signed)
I,Kathryn Rivera,acting as a Education administrator for Kathryn Brine, FNP.,have documented all relevant documentation on the behalf of Kathryn Brine, FNP,as directed by  Kathryn Brine, FNP while in the presence of Kathryn Rivera, Kathryn Rivera.    Subjective:     Patient ID: Kathryn Rivera , female    DOB: 11-17-1965 , 56 y.o.   MRN: [id number]   Chief Complaint  Patient presents with   Diabetes    HPI  Patient presents today for DM f/u.   She adds, GYN Kathryn Rivera retired. She is on the search for new GYN.  Patient reports she was started on 2.30m Mounjaro , she is supposed to pick up the 560mthis month for refill. Patient questions, is she supposed to stay on 5MG dose, or will it be increased? She is not exercising due to a fractured toe 5th metatarsal on left. She twisted her right foot this week.   Wt Readings from Last 3 Encounters: 12/10/21 : 234 lb (106.1 kg) 08/27/21 : 233 lb 12.8 oz (106.1 kg) 01/30/21 : 235 lb 9.6 oz (106.9 kg)    She had been on [place] she was having an increase in eating  Diabetes She presents for her follow-up diabetic visit. She has type 2 diabetes mellitus. There are no hypoglycemic associated symptoms. There are no diabetic associated symptoms. Pertinent negatives for diabetes include no fatigue, no polydipsia, no polyphagia and no polyuria. There are no hypoglycemic complications. There are no diabetic complications. Risk factors for coronary artery disease include obesity, sedentary lifestyle, diabetes mellitus and hypertension. Current diabetic treatment includes oral agent (dual therapy).     Past Medical History:  Diagnosis Date   Diabetes mellitus without complication ([facility]   Hyperlipidemia    Hypertension      Family History  Problem Relation Age of Onset   Hypertension Mother    Diabetes Mother    Breast cancer Mother    Heart attack Mother    Hypertension Father    Stroke Father    Hypertension Maternal Grandfather    Cancer Maternal  Grandfather    Hypertension Paternal Grandmother    Stroke Paternal Grandmother      Current Outpatient Medications:    blood glucose meter kit and supplies KIT, Dispense based on patient and insurance preference. Check blood sugar 3 times a day before meals and bedtime, Disp: 1 each, Rfl: 0   Continuous Blood Gluc Sensor (DEXCOM G7 SENSOR) MISC, Use to check blood sugar 3 times a day. Dx code e11.65, Disp: 3 each, Rfl: 3   dapagliflozin propanediol (FARXIGA) 10 MG TABS tablet, Take 1 tablet (10 mg total) by mouth daily before breakfast., Disp: 90 tablet, Rfl: 1   Insulin Pen Needle (NOVOFINE PLUS PEN NEEDLE) 32G X 4 MM MISC, 1 each by Does not apply route daily. Use with TrTyler AasDisp: 200 each, Rfl: 2   olmesartan-hydrochlorothiazide (BENICAR HCT) 40-25 MG tablet, Take 1 tablet by mouth daily., Disp: 30 tablet, Rfl: 2   pravastatin (PRAVACHOL) 40 MG tablet, Take 1 tablet (40 mg total) by mouth daily., Disp: 90 tablet, Rfl: 1   tirzepatide (MOUNJARO) 7.5 MG/0.5ML Pen, Inject 7.5 mg into the skin once a week., Disp: 2 mL, Rfl: 3   No Known Allergies   Review of Systems  Constitutional: Negative.  Negative for fatigue.  Respiratory: Negative.    Cardiovascular: Negative.   Endocrine: Negative for polydipsia, polyphagia and polyuria.  Neurological: Negative.   Psychiatric/Behavioral: Negative.  Today's Vitals   12/10/21 1410 12/10/21 1451  BP: (!) 122/90 122/88  Pulse: 99   Temp: 98.1 F (36.7 C)   SpO2: 98%   Weight: 234 lb (106.1 kg)   Height: _0  (1.702 m)    Body mass index is 36.65 kg/m.  She is encouraged to strive for BMI less than 30 to decrease cardiac risk. Advised to aim for at least 150 minutes of exercise per week.  Objective:  Physical Exam Vitals reviewed.  Constitutional:      General: She is not in acute distress.    Appearance: Normal appearance. She is well-developed. She is obese.  Cardiovascular:     Rate and Rhythm: Normal rate and regular  rhythm.     Pulses: Normal pulses.     Heart sounds: Normal heart sounds. No murmur heard. Pulmonary:     Effort: Pulmonary effort is normal. No respiratory distress.     Breath sounds: Normal breath sounds. No wheezing.  Chest:     Chest wall: No tenderness.  Musculoskeletal:        General: Normal range of motion.  Skin:    General: Skin is warm and dry.     Capillary Refill: Capillary refill takes less than 2 seconds.  Neurological:     General: No focal deficit present.     Mental Status: She is alert and oriented to person, place, and time.     Cranial Nerves: No cranial nerve deficit.     Motor: No weakness.  Psychiatric:        Mood and Affect: Mood normal.        Behavior: Behavior normal.        Thought Content: Thought content normal.        Judgment: Judgment normal.         Assessment And Plan:     1. Uncontrolled type 2 diabetes mellitus with hyperglycemia ([place]) Comments: She had increased appetite with Tyler Aas and is now on Mounjaro will increase to 7.5 mg daily. Unable to get urinalysis - CMP14+EGFR - Hemoglobin A1c - tirzepatide (MOUNJARO) 7.5 MG/0.5ML Pen; Inject 7.5 mg into the skin once a week.  Dispense: 2 mL; Refill: 3 - Amb Ref to Medical Weight Management  2. Essential hypertension Comments: Blood pressure slightly elevated. Will change her lisinopril to olmesartan/HCTZ return in 2 weeks for BP NV. - CMP14+EGFR - olmesartan-hydrochlorothiazide (BENICAR HCT) 40-25 MG tablet; Take 1 tablet by mouth daily.  Dispense: 30 tablet; Refill: 2 - Amb Ref to Medical Weight Management  3. Mixed hyperlipidemia Comments: Continue statin, tolerating well. - CMP14+EGFR - Lipid panel  4. Anemia, unspecified type Comments: Will check iron studies. - Iron, TIBC and Ferritin Panel  5. Need for influenza vaccination Influenza vaccine administered Encouraged to take Tylenol as needed for fever or muscle aches. - Flu Vaccine QUAD 6+ mos PF IM (Fluarix Quad  PF)  6. Need for Tdap vaccination Will give tetanus vaccine today while in office. Refer to order management. TDAP will be administered to adults 45-72 years old every 10 years. - Tdap vaccine greater than or equal to 7yo IM  7. Class 2 obesity due to excess calories with body mass index (BMI) of 36.0 to 36.9 in adult, unspecified whether serious comorbidity present She is encouraged to strive for BMI less than 30 to decrease cardiac risk. Advised to aim for at least 150 minutes of exercise per week.  - Amb Ref to Medical Weight Management  8. Other long  term (current) drug therapy - CBC  9. Encounter for gynecological examination - Ambulatory referral to Gynecology    Patient was given opportunity to ask questions. Patient verbalized understanding of the plan and was able to repeat key elements of the plan. All questions were answered to their satisfaction.  Kathryn Brine, FNP   I, Kathryn Brine, FNP, have reviewed all documentation for this visit. The documentation on 12/25/21 for the exam, diagnosis, procedures, and orders are all accurate and complete.   IF YOU HAVE BEEN REFERRED TO A SPECIALIST, IT MAY TAKE 1-2 WEEKS TO SCHEDULE/PROCESS THE REFERRAL. IF YOU HAVE NOT HEARD FROM US/SPECIALIST IN TWO WEEKS, PLEASE GIVE [place] A CALL AT [phone].   THE PATIENT IS ENCOURAGED TO PRACTICE SOCIAL DISTANCING DUE TO THE COVID-19 PANDEMIC.

## 2021-12-11 LAB — CBC
Hematocrit: 44.6 % (ref 34.0–46.6)
Hemoglobin: 13.9 g/dL (ref 11.1–15.9)
MCH: 21.9 pg — ABNORMAL LOW (ref 26.6–33.0)
MCHC: 31.2 g/dL — ABNORMAL LOW (ref 31.5–35.7)
MCV: 70 fL — ABNORMAL LOW (ref 79–97)
Platelets: 341 10*3/uL (ref 150–450)
RBC: 6.36 x10E6/uL — ABNORMAL HIGH (ref 3.77–5.28)
RDW: 16.5 % — ABNORMAL HIGH (ref 11.7–15.4)
WBC: 4.5 10*3/uL (ref 3.4–10.8)

## 2021-12-11 LAB — CMP14+EGFR
ALT: 9 IU/L (ref 0–32)
AST: 11 IU/L (ref 0–40)
Albumin/Globulin Ratio: 1.6 (ref 1.2–2.2)
Albumin: 4.2 g/dL (ref 3.8–4.9)
Alkaline Phosphatase: 73 IU/L (ref 44–121)
BUN/Creatinine Ratio: 23 (ref 9–23)
BUN: 16 mg/dL (ref 6–24)
Bilirubin Total: 0.4 mg/dL (ref 0.0–1.2)
CO2: 26 mmol/L (ref 20–29)
Calcium: 9.7 mg/dL (ref 8.7–10.2)
Chloride: 98 mmol/L (ref 96–106)
Creatinine, Ser: 0.71 mg/dL (ref 0.57–1.00)
Globulin, Total: 2.7 g/dL (ref 1.5–4.5)
Glucose: 120 mg/dL — ABNORMAL HIGH (ref 70–99)
Potassium: 3.6 mmol/L (ref 3.5–5.2)
Sodium: 142 mmol/L (ref 134–144)
Total Protein: 6.9 g/dL (ref 6.0–8.5)
eGFR: 101 mL/min/{1.73_m2} (ref >59)

## 2021-12-11 LAB — IRON,TIBC AND FERRITIN PANEL
Ferritin: 70 ng/mL (ref 15–150)
Iron Saturation: 25 % (ref 15–55)
Iron: 75 ug/dL (ref 27–159)
Total Iron Binding Capacity: 303 ug/dL (ref 250–450)
UIBC: 228 ug/dL (ref 131–425)

## 2021-12-11 LAB — LIPID PANEL
Chol/HDL Ratio: 4.5 ratio — ABNORMAL HIGH (ref 0.0–4.4)
Cholesterol, Total: 232 mg/dL — ABNORMAL HIGH (ref 100–199)
HDL: 51 mg/dL (ref >39)
LDL Chol Calc (NIH): 149 mg/dL — ABNORMAL HIGH (ref 0–99)
Triglycerides: 177 mg/dL — ABNORMAL HIGH (ref 0–149)
VLDL Cholesterol Cal: 32 mg/dL (ref 5–40)

## 2021-12-11 LAB — HEMOGLOBIN A1C
Est. average glucose Bld gHb Est-mCnc: 229 mg/dL
Hgb A1c MFr Bld: 9.6 % — ABNORMAL HIGH (ref 4.8–5.6)

## 2022-02-05 ENCOUNTER — Encounter: Payer: Self-pay | Admitting: Nurse Practitioner

## 2022-02-17 ENCOUNTER — Other Ambulatory Visit: Payer: Self-pay | Admitting: Nurse Practitioner

## 2022-02-25 ENCOUNTER — Other Ambulatory Visit: Payer: Self-pay | Admitting: Nurse Practitioner

## 2022-02-25 DIAGNOSIS — E1165 Type 2 diabetes mellitus with hyperglycemia: Secondary | ICD-10-CM

## 2022-02-25 MED ORDER — MOUNJARO 10 MG/0.5ML ~~LOC~~ SOAJ: 10.0000 mg | SUBCUTANEOUS | 1 refills | Status: DC

## 2022-04-10 ENCOUNTER — Other Ambulatory Visit (HOSPITAL_COMMUNITY): Payer: Self-pay

## 2022-04-10 ENCOUNTER — Encounter: Payer: Self-pay | Admitting: Nurse Practitioner

## 2022-04-10 ENCOUNTER — Ambulatory Visit: Payer: Managed Care, Other (non HMO) | Admitting: Nurse Practitioner

## 2022-04-10 ENCOUNTER — Other Ambulatory Visit: Payer: Self-pay

## 2022-04-10 VITALS — BP 130/80 | HR 96 | Temp 97.9°F | Ht 67.0 in | Wt 252.6 lb

## 2022-04-10 DIAGNOSIS — E782 Mixed hyperlipidemia: Secondary | ICD-10-CM

## 2022-04-10 DIAGNOSIS — Z2821 Immunization not carried out because of patient refusal: Secondary | ICD-10-CM | POA: Diagnosis not present

## 2022-04-10 DIAGNOSIS — I10 Essential (primary) hypertension: Secondary | ICD-10-CM

## 2022-04-10 DIAGNOSIS — E1165 Type 2 diabetes mellitus with hyperglycemia: Secondary | ICD-10-CM

## 2022-04-10 MED ORDER — ACCU-CHEK GUIDE ME W/DEVICE KIT: 1.0000 | PACK | Freq: Three times a day (TID) | 0 refills | Status: DC

## 2022-04-10 MED ORDER — ACCU-CHEK GUIDE VI STRP: ORAL_STRIP | 12 refills | Status: DC

## 2022-04-10 MED ORDER — MOUNJARO 7.5 MG/0.5ML ~~LOC~~ SOAJ: 7.5000 mg | SUBCUTANEOUS | 1 refills | Status: DC

## 2022-04-10 MED ORDER — MOUNJARO 7.5 MG/0.5ML ~~LOC~~ SOAJ
7.5000 mg | SUBCUTANEOUS | 1 refills | Status: DC
  Filled 2022-04-10: qty 2, 28d supply, fill #0

## 2022-04-10 NOTE — Patient Instructions (Signed)

## 2022-04-10 NOTE — Progress Notes (Signed)
Barnet Glasgow Martin,acting as a Education administrator for Minette Brine, FNP.,have documented all relevant documentation on the behalf of Minette Brine, FNP,as directed by  Minette Brine, FNP while in the presence of Minette Brine, [place].    Subjective:     Patient ID: Kathryn Rivera , female    DOB: [birth year] , 57 y.o.   MRN: [id number]   Chief Complaint  Patient presents with   Hypertension   Diabetes    HPI  Patient presents today for a DM, BP, and Chol check. Patient states compliance with medications and has no other concerns today. Patient states she does not take the [facility] because it made her legs cramp so she went back and started taking lisinopril-hctz. She reports her leg cramps are now gone since stopping the olmesartan-hctz. She drinks at 64 oz water a day. She stopped the same week in November 2023. She ate at [employer] last night. She is not exercising due to her meniscus, she is planning to have this checked to see if needs surgical intervention. Her blood sugar will be 170 in am after drinking a premiere shake 1-2 hours later. She took her last dose of mounjaro last Tuesday due to back order of her current dose of 10 mg.   She plans to schedule her colonoscopy and PAP     BP Readings from Last 3 Encounters: 04/10/22 : (!) 130/90 12/10/21 : 122/88 10/15/21 : (!) 160/98    Hypertension This is a chronic problem. The current episode started more than 1 year ago. The problem is controlled. Pertinent negatives include no anxiety. Risk factors for coronary artery disease include obesity and sedentary lifestyle. Compliance problems include exercise (due to knee issues).   Diabetes She presents for her follow-up diabetic visit. She has type 2 diabetes mellitus. There are no hypoglycemic associated symptoms. There are no diabetic associated symptoms. Pertinent negatives for diabetes include no fatigue, no polydipsia, no polyphagia and no polyuria. There are no hypoglycemic complications. There  are no diabetic complications. Risk factors for coronary artery disease include obesity, sedentary lifestyle, diabetes mellitus and hypertension. Current diabetic treatment includes oral agent (dual therapy). She is following a diabetic and generally healthy diet. She has not had a previous visit with a dietitian. She rarely participates in exercise. (Average blood sugar range 140-170. ) An ACE inhibitor/angiotensin II receptor blocker is being taken. She does not see a podiatrist.Eye exam is not current.     Past Medical History:  Diagnosis Date   Diabetes mellitus without complication ([place])    Hyperlipidemia    Hypertension      Family History  Problem Relation Age of Onset   Hypertension Mother    Diabetes Mother    Breast cancer Mother    Heart attack Mother    Hypertension Father    Stroke Father    Hypertension Maternal Grandfather    Cancer Maternal Grandfather    Hypertension Paternal Grandmother    Stroke Paternal Grandmother      Current Outpatient Medications:    Blood Glucose Monitoring Suppl (ACCU-CHEK GUIDE ME) w/Device KIT, 1 each by Does not apply route 4 (four) times daily - after meals and at bedtime., Disp: 1 kit, Rfl: 0   dapagliflozin propanediol (FARXIGA) 10 MG TABS tablet, Take 1 tablet (10 mg total) by mouth daily before breakfast., Disp: 90 tablet, Rfl: 1   glucose blood (ACCU-CHEK GUIDE) test strip, Use as instructed, Disp: 100 each, Rfl: 12   lisinopril-hydrochlorothiazide (ZESTORETIC) 20-25 MG tablet, Take  1 tablet by mouth daily., Disp: , Rfl:    pravastatin (PRAVACHOL) 40 MG tablet, Take 1 tablet (40 mg total) by mouth daily., Disp: 90 tablet, Rfl: 1   tirzepatide (MOUNJARO) 7.5 MG/0.5ML Pen, Inject 7.5 mg into the skin once a week., Disp: 6 mL, Rfl: 1   Continuous Blood Gluc Sensor (DEXCOM G7 SENSOR) MISC, Use to check blood sugar 3 times a day. Dx code e11.65 (Patient not taking: Reported on 04/10/2022), Disp: 3 each, Rfl: 3   Allergies  Allergen  Reactions   Olmesartan Medoxomil-Hctz Other (See Comments)    Cramps.      Review of Systems  Constitutional: Negative.  Negative for fatigue.  Respiratory: Negative.    Cardiovascular: Negative.   Endocrine: Negative for polydipsia, polyphagia and polyuria.  Neurological: Negative.   Psychiatric/Behavioral: Negative.       Today's Vitals   04/10/22 0835 04/10/22 0903  BP: (!) 130/90 130/80  Pulse: 96   Temp: 97.9 F (36.6 C)   TempSrc: Oral   Weight: 252 lb 9.6 oz (114.6 kg)   Height: '5\' 7"'$  (1.702 m)   PainSc: 0-No pain    Body mass index is 39.56 kg/m.  Wt Readings from Last 3 Encounters:  04/10/22 252 lb 9.6 oz (114.6 kg)  12/10/21 234 lb (106.1 kg)  08/27/21 233 lb 12.8 oz (106.1 kg)    Objective:  Physical Exam Vitals reviewed.  Constitutional:      General: She is not in acute distress.    Appearance: Normal appearance. She is well-developed. She is obese.  Cardiovascular:     Rate and Rhythm: Normal rate and regular rhythm.     Pulses: Normal pulses.     Heart sounds: Normal heart sounds. No murmur heard. Pulmonary:     Effort: Pulmonary effort is normal. No respiratory distress.     Breath sounds: Normal breath sounds. No wheezing.  Chest:     Chest wall: No tenderness.  Musculoskeletal:        General: Normal range of motion.  Skin:    General: Skin is warm and dry.     Capillary Refill: Capillary refill takes less than 2 seconds.  Neurological:     General: No focal deficit present.     Mental Status: She is alert and oriented to person, place, and time.     Cranial Nerves: No cranial nerve deficit.     Motor: No weakness.  Psychiatric:        Mood and Affect: Mood normal.        Behavior: Behavior normal.        Thought Content: Thought content normal.        Judgment: Judgment normal.         Assessment And Plan:     1. Uncontrolled type 2 diabetes mellitus with hyperglycemia ([place]) Comments: HgbA1c improved at last visit, will check  levels today. Continue focusing on healthy diet low in sugar and starches. - Hemoglobin A1c - Microalbumin / Creatinine Urine Ratio - Blood Glucose Monitoring Suppl (ACCU-CHEK GUIDE ME) w/Device KIT; 1 each by Does not apply route 4 (four) times daily - after meals and at bedtime.  Dispense: 1 kit; Refill: 0 - glucose blood (ACCU-CHEK GUIDE) test strip; Use as instructed  Dispense: 100 each; Refill: 12 - tirzepatide (MOUNJARO) 7.5 MG/0.5ML Pen; Inject 7.5 mg into the skin once a week.  Dispense: 6 mL; Refill: 1 - POCT Urinalysis Dipstick (81002)  2. Essential hypertension Comments: Blood pressure is  slightly elevated, continue with low salt diet and exercise. She stopped taking olmesartan due to foot/leg cramps back on lisinopril/hctz - BMP8+eGFR  3. Mixed hyperlipidemia Comments: Stable, will check lipid panel - Lipid panel  4. Herpes zoster vaccination declined Declines shingrix, educated on disease process and is aware if he changes his mind to notify office  5. COVID-19 vaccination declined Declines covid 19 vaccine. Discussed risk of covid 58 and if she changes her mind about the vaccine to call the office. Education has been provided regarding the importance of this vaccine but patient still declined. Advised may receive this vaccine at local pharmacy or Health Dept.or vaccine clinic. Aware to provide a copy of the vaccination record if obtained from local pharmacy or Health Dept.  Encouraged to take multivitamin, vitamin d, vitamin c and zinc to increase immune system. Aware can call office if would like to have vaccine here at office. Verbalized acceptance and understanding.    Patient was given opportunity to ask questions. Patient verbalized understanding of the plan and was able to repeat key elements of the plan. All questions were answered to their satisfaction.  Minette Brine, FNP   I, Minette Brine, FNP, have reviewed all documentation for this visit. The documentation on  04/10/22 for the exam, diagnosis, procedures, and orders are all accurate and complete.   IF YOU HAVE BEEN REFERRED TO A SPECIALIST, IT MAY TAKE 1-2 WEEKS TO SCHEDULE/PROCESS THE REFERRAL. IF YOU HAVE NOT HEARD FROM US/SPECIALIST IN TWO WEEKS, PLEASE GIVE [place] A CALL AT [phone].   THE PATIENT IS ENCOURAGED TO PRACTICE SOCIAL DISTANCING DUE TO THE COVID-19 PANDEMIC.

## 2022-04-11 LAB — BMP8+EGFR
BUN/Creatinine Ratio: 24 — ABNORMAL HIGH (ref 9–23)
BUN: 19 mg/dL (ref 6–24)
CO2: 24 mmol/L (ref 20–29)
Calcium: 9.8 mg/dL (ref 8.7–10.2)
Chloride: 99 mmol/L (ref 96–106)
Creatinine, Ser: 0.78 mg/dL (ref 0.57–1.00)
Glucose: 142 mg/dL — ABNORMAL HIGH (ref 70–99)
Potassium: 3.9 mmol/L (ref 3.5–5.2)
Sodium: 141 mmol/L (ref 134–144)
eGFR: 90 mL/min/{1.73_m2} (ref >59)

## 2022-04-11 LAB — LIPID PANEL
Chol/HDL Ratio: 4.1 ratio (ref 0.0–4.4)
Cholesterol, Total: 208 mg/dL — ABNORMAL HIGH (ref 100–199)
HDL: 51 mg/dL (ref >39)
LDL Chol Calc (NIH): 133 mg/dL — ABNORMAL HIGH (ref 0–99)
Triglycerides: 134 mg/dL (ref 0–149)
VLDL Cholesterol Cal: 24 mg/dL (ref 5–40)

## 2022-04-11 LAB — HEMOGLOBIN A1C
Est. average glucose Bld gHb Est-mCnc: 194 mg/dL
Hgb A1c MFr Bld: 8.4 % — ABNORMAL HIGH (ref 4.8–5.6)

## 2022-04-12 LAB — MICROALBUMIN / CREATININE URINE RATIO
Creatinine, Urine: 85.1 mg/dL
Microalb/Creat Ratio: 6 mg/g creat (ref 0–29)
Microalbumin, Urine: 4.9 ug/mL

## 2022-04-17 ENCOUNTER — Other Ambulatory Visit: Payer: Self-pay

## 2022-04-17 ENCOUNTER — Other Ambulatory Visit: Payer: Self-pay | Admitting: Nurse Practitioner

## 2022-04-17 DIAGNOSIS — I10 Essential (primary) hypertension: Secondary | ICD-10-CM

## 2022-04-17 DIAGNOSIS — Z139 Encounter for screening, unspecified: Secondary | ICD-10-CM

## 2022-04-17 MED ORDER — LISINOPRIL-HYDROCHLOROTHIAZIDE 20-25 MG PO TABS: 1.0000 | ORAL_TABLET | Freq: Every day | ORAL | 1 refills | Status: DC

## 2022-05-05 ENCOUNTER — Encounter: Payer: Self-pay | Admitting: Nurse Practitioner

## 2022-05-06 ENCOUNTER — Other Ambulatory Visit: Payer: Self-pay | Admitting: Nurse Practitioner

## 2022-05-06 DIAGNOSIS — E1165 Type 2 diabetes mellitus with hyperglycemia: Secondary | ICD-10-CM

## 2022-05-06 MED ORDER — MOUNJARO 7.5 MG/0.5ML ~~LOC~~ SOAJ: 7.5000 mg | SUBCUTANEOUS | 1 refills | Status: DC

## 2022-05-22 ENCOUNTER — Encounter (HOSPITAL_COMMUNITY): Payer: Self-pay

## 2022-05-22 ENCOUNTER — Ambulatory Visit (HOSPITAL_COMMUNITY): Payer: Managed Care, Other (non HMO)

## 2022-05-22 ENCOUNTER — Ambulatory Visit (HOSPITAL_COMMUNITY)
Admission: EM | Admit: 2022-05-22 | Discharge: 2022-05-22 | Disposition: A | Discharge status: Home / Self Care | Payer: Managed Care, Other (non HMO) | Attending: Emergency Medicine | Admitting: Emergency Medicine

## 2022-05-22 DIAGNOSIS — S2231XA Fracture of one rib, right side, initial encounter for closed fracture: Secondary | ICD-10-CM | POA: Diagnosis not present

## 2022-05-22 MED ORDER — METHOCARBAMOL 500 MG PO TABS
500.0000 mg | ORAL_TABLET | Freq: Two times a day (BID) | ORAL | 0 refills | Status: AC | PRN
Start: 2022-05-22 — End: ?

## 2022-05-22 NOTE — ED Triage Notes (Signed)
Pt presents with c/o lower back pain that moves to the breast bone. States she has not been able to take deep breaths.   States she fell 3 days ago.

## 2022-05-22 NOTE — ED Provider Notes (Addendum)
[facility]    CSN: [id number] Arrival date & time: 05/22/22  1734      History   Chief Complaint Chief Complaint  Patient presents with   Fall    HPI Kathryn Rivera is a 57 y.o. female.  4 days ago she tripped on a curb and fell onto her right side.  2 days ago she was reaching for a low cabinet and felt pain along her right side.  Feels it under the right breast to right mid-axillary.  Worse with deep breath. Has been alternating with ibuprofen and Tylenol, applying ice to the area.  Also using robaxin. She reports pain has actually improved today. Denies any rash, bruising, skin changes. No shortness of breath.   Past Medical History:  Diagnosis Date   Diabetes mellitus without complication (HCC)    Hyperlipidemia    Hypertension     There are no problems to display for this patient.   Past Surgical History:  Procedure Laterality Date   BREAST REDUCTION SURGERY     SHOULDER OPEN ROTATOR CUFF REPAIR      OB History   No obstetric history on file.      Home Medications    Prior to Admission medications   Medication Sig Start Date End Date Taking? Authorizing Provider  methocarbamol (ROBAXIN) 500 MG tablet Take 1 tablet (500 mg total) by mouth 2 (two) times daily as needed. 05/22/22  Yes Taelynn Mcelhannon, Lurena Joiner, PA-C  Blood Glucose Monitoring Suppl (ACCU-CHEK GUIDE ME) w/Device KIT 1 each by Does not apply route 4 (four) times daily - after meals and at bedtime. 04/10/22   [name], FNP  dapagliflozin propanediol (FARXIGA) 10 MG TABS tablet Take 1 tablet (10 mg total) by mouth daily before breakfast. 08/27/21   [name], FNP  glucose blood (ACCU-CHEK GUIDE) test strip Use as instructed 04/10/22   [name], FNP  lisinopril-hydrochlorothiazide (ZESTORETIC) 20-25 MG tablet Take 1 tablet by mouth daily. 04/17/22   [name], FNP  pravastatin (PRAVACHOL) 40 MG tablet Take 1 tablet (40 mg total) by mouth daily. 08/27/21   [name], FNP  tirzepatide  [facility]) 7.5 MG/0.5ML Pen Inject 7.5 mg into the skin once a week. 05/06/22   [name], FNP    Family History Family History  Problem Relation Age of Onset   Hypertension Mother    Diabetes Mother    Breast cancer Mother    Heart attack Mother    Hypertension Father    Stroke Father    Hypertension Maternal Grandfather    Cancer Maternal Grandfather    Hypertension Paternal Grandmother    Stroke Paternal Grandmother     Social History Social History   Tobacco Use   Smoking status: Never    Passive exposure: Never   Smokeless tobacco: Never  Substance Use Topics   Alcohol use: Never   Drug use: Never     Allergies   Olmesartan medoxomil-hctz   Review of Systems Review of Systems As per HPI  Physical Exam Triage Vital Signs ED Triage Vitals  Enc Vitals Group     BP 05/22/22 1803 (!) 131/92     Pulse Rate 05/22/22 1803 (!) 109     Resp 05/22/22 1803 18     Temp 05/22/22 1803 98.8 F (37.1 C)     Temp Source 05/22/22 1803 Oral     SpO2 05/22/22 1803 96 %     Weight --      Height --  Head Circumference --      Peak Flow --      Pain Score 05/22/22 1802 5     Pain Loc --      Pain Edu? --      Excl. in GC? --    No data found.  Updated Vital Signs BP (!) 131/92 (BP Location: Left Arm)   Pulse (!) 109   Temp 98.8 F (37.1 C) (Oral)   Resp 18   SpO2 96%   Physical Exam Vitals and nursing note reviewed.  Constitutional:      General: She is not in acute distress. HENT:     Mouth/Throat:     Mouth: Mucous membranes are moist.     Pharynx: Oropharynx is clear. No posterior oropharyngeal erythema.  Eyes:     Conjunctiva/sclera: Conjunctivae normal.  Cardiovascular:     Rate and Rhythm: Normal rate and regular rhythm.     Pulses: Normal pulses.     Heart sounds: Normal heart sounds.  Pulmonary:     Effort: Pulmonary effort is normal. No respiratory distress.     Breath sounds: Normal breath sounds. No wheezing.     Comments: Clear  lung sounds heard throughout. She is a little tender under the right breast around the 8th rib. Chest:     Chest wall: Tenderness present.  Musculoskeletal:     Cervical back: Normal range of motion.  Lymphadenopathy:     Cervical: No cervical adenopathy.  Skin:    General: Skin is warm and dry.     Findings: No bruising, erythema or rash.     Comments: No skin changes  Neurological:     Mental Status: She is alert and oriented to person, place, and time.     UC Treatments / Results  Labs (all labs ordered are listed, but only abnormal results are displayed) Labs Reviewed - No data to display  EKG  Radiology DG Ribs Unilateral W/Chest Right  Result Date: 05/22/2022 CLINICAL DATA:  Fall, fall, low back pain and chest pain EXAM: RIGHT RIBS AND CHEST - 3+ VIEW COMPARISON:  None Available. FINDINGS: There is a right lateral 10th rib fracture. No effusion or pneumothorax. Right base atelectasis. Heart is normal size. Left lung clear. IMPRESSION: Right lateral 10th rib fracture.  No effusion or pneumothorax. Electronically Signed   By: Charlett Nose M.D.   On: 05/22/2022 19:28    Procedures Procedures (including critical care time)  Medications Ordered in UC Medications - No data to display  Initial Impression / Assessment and Plan / UC Course  I have reviewed the triage vital signs and the nursing notes.  Pertinent labs & imaging results that were available during my care of the patient were reviewed by me and considered in my medical decision making (see chart for details).  Ribs xray reveals 10th rib fracture right lateral. Reviewed by me, agree with radiology interpretation. This is just below the area of pain on exam. Discussed that the fracture will heal on its own, and to continue ibuprofen/tylenol and applying ice. Muscle relaxer BID prn. She is instructed to return with any worsening symptoms. ED precautions discussed. Will also follow with PCP   Final Clinical  Impressions(s) / UC Diagnoses   Final diagnoses:  Closed fracture of one rib of right side, initial encounter     Discharge Instructions      I will call you if anything is abnormal on your x-ray and requires intervention.  I recommend you continue  using ibuprofen and Tylenol for pain and inflammation.  Continue applying ice to the area for 15 minutes at a time, several times daily.  You can take the muscle relaxer (robaxin) twice daily. If the medication makes you drowsy, take only at bed time.  Hopefully you will continue to improve over the next few days.  You can always return if needed.  Otherwise I recommend to follow-up with your primary care provider    ED Prescriptions     Medication Sig Dispense Auth. Provider   methocarbamol (ROBAXIN) 500 MG tablet Take 1 tablet (500 mg total) by mouth 2 (two) times daily as needed. 20 tablet Irmalee Riemenschneider, Lurena Joiner, PA-C      PDMP not reviewed this encounter.       Kathrine Haddock 05/22/22 1610

## 2022-05-22 NOTE — Discharge Instructions (Addendum)
I will call you if anything is abnormal on your x-ray and requires intervention.  I recommend you continue using ibuprofen and Tylenol for pain and inflammation.  Continue applying ice to the area for 15 minutes at a time, several times daily.  You can take the muscle relaxer (robaxin) twice daily. If the medication makes you drowsy, take only at bed time.  Hopefully you will continue to improve over the next few days.  You can always return if needed.  Otherwise I recommend to follow-up with your primary care provider

## 2022-05-29 IMAGING — MG MM DIGITAL SCREENING BILAT W/ TOMO AND CAD
8 series · 8 of 24 positions shown · non-contrast
Comparison: Previous mammograms dating back to 5424.

CLINICAL DATA: Screening.

EXAM:
DIGITAL SCREENING BILATERAL MAMMOGRAM WITH TOMOSYNTHESIS AND CAD
TECHNIQUE: Bilateral screening digital craniocaudal and mediolateral oblique
mammograms were obtained. Bilateral screening digital breast
tomosynthesis was performed. The images were evaluated with
computer-aided detection.

[L CC synth-2D]
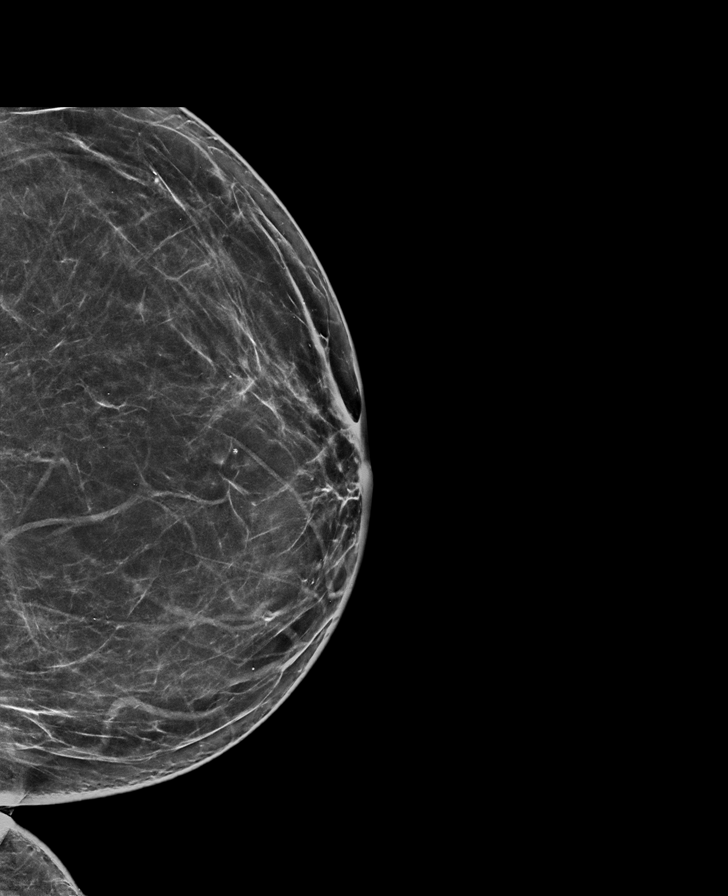

[R CC synth-2D]
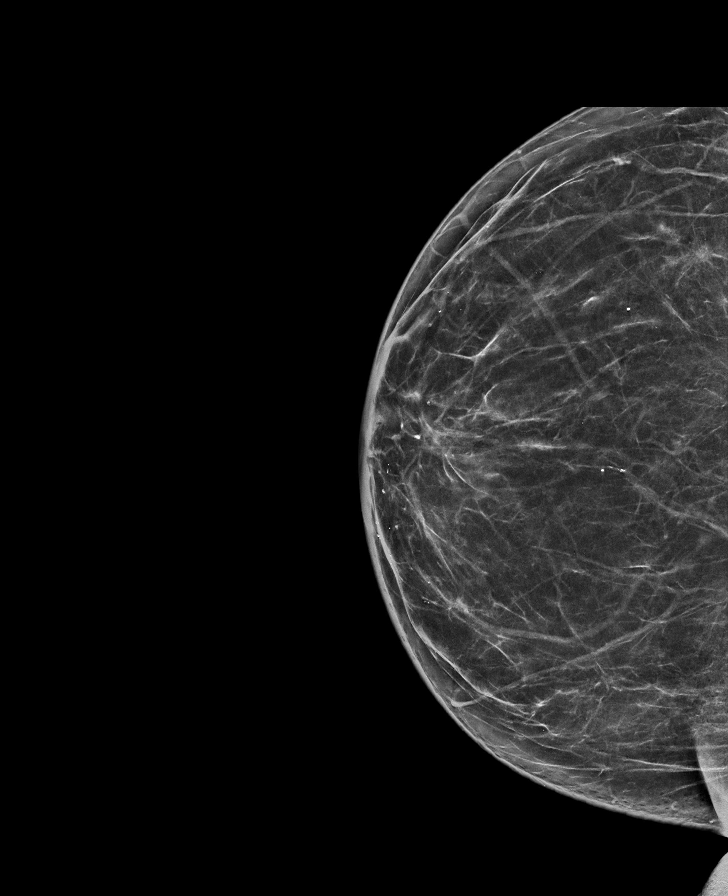

[L MLO synth-2D]
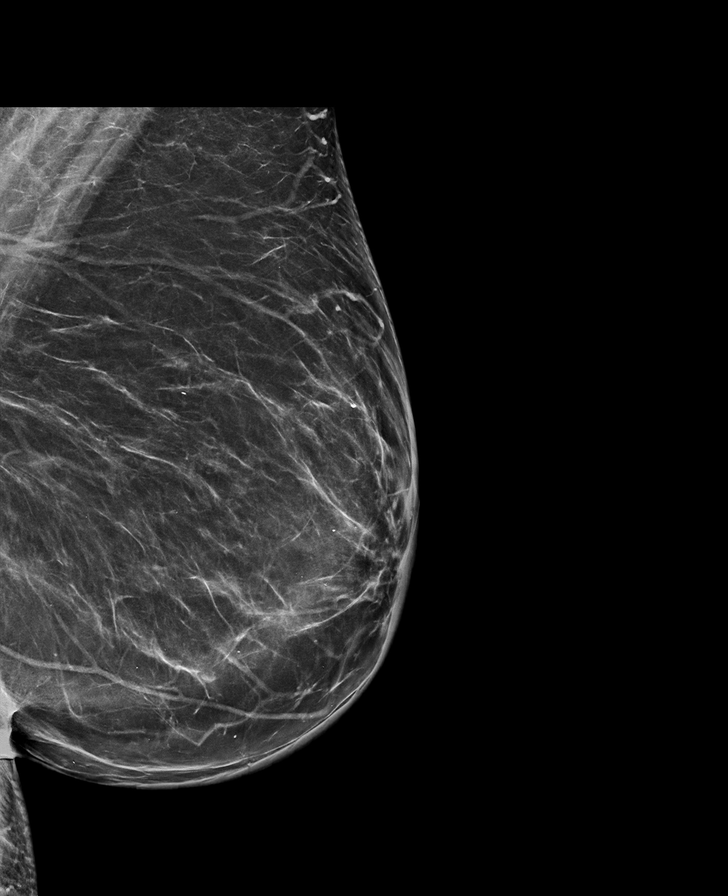

[R MLO synth-2D]
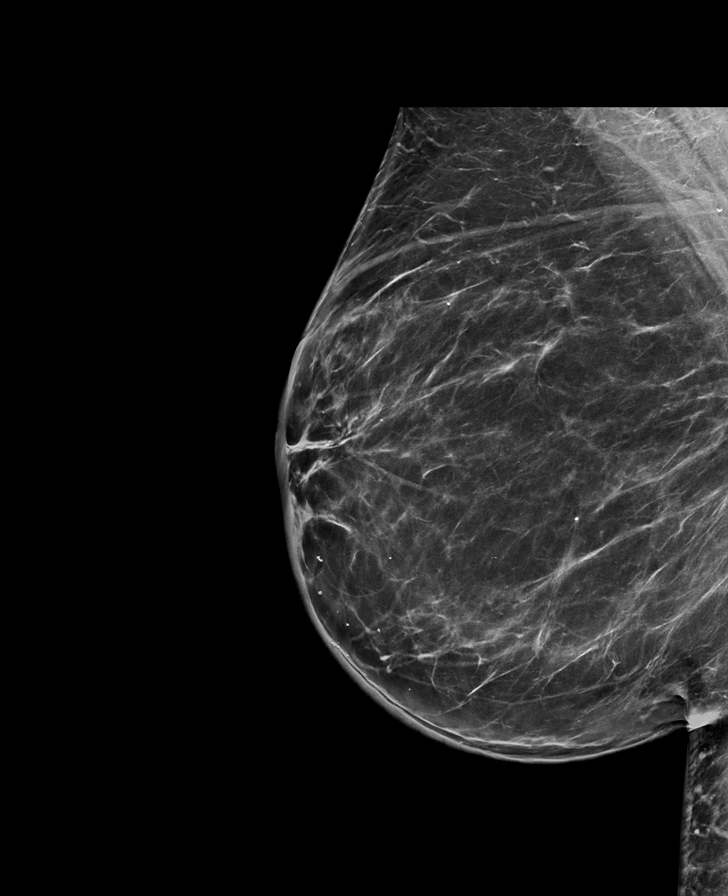

[L CC tomo · tomo slice 39/76.0]
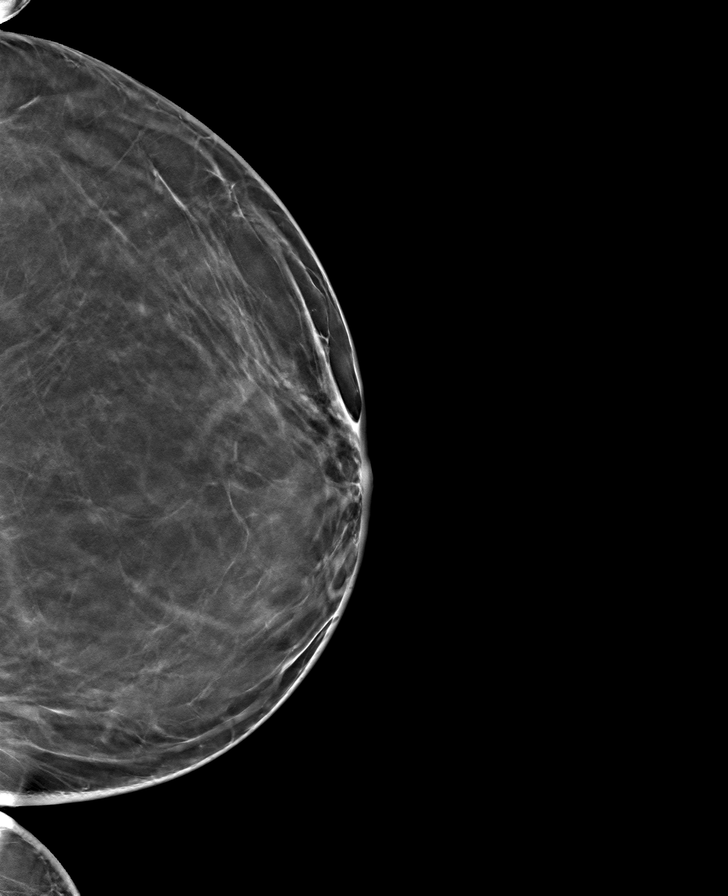

[L MLO tomo · tomo slice 43/85.0]
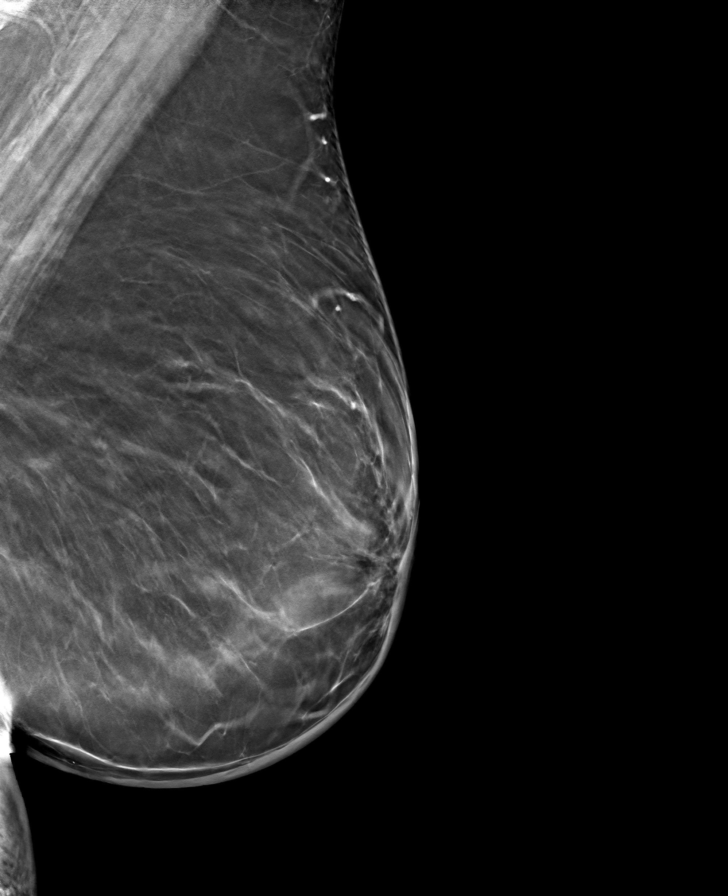

[R MLO tomo · tomo slice 43/85.0]
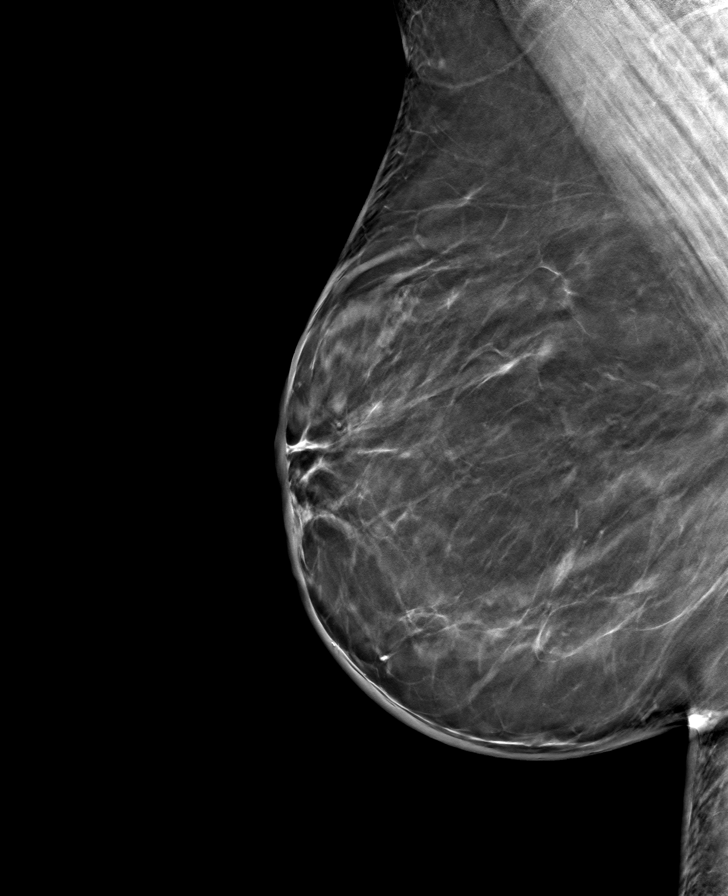

[R CC tomo · tomo slice 37/74.0]
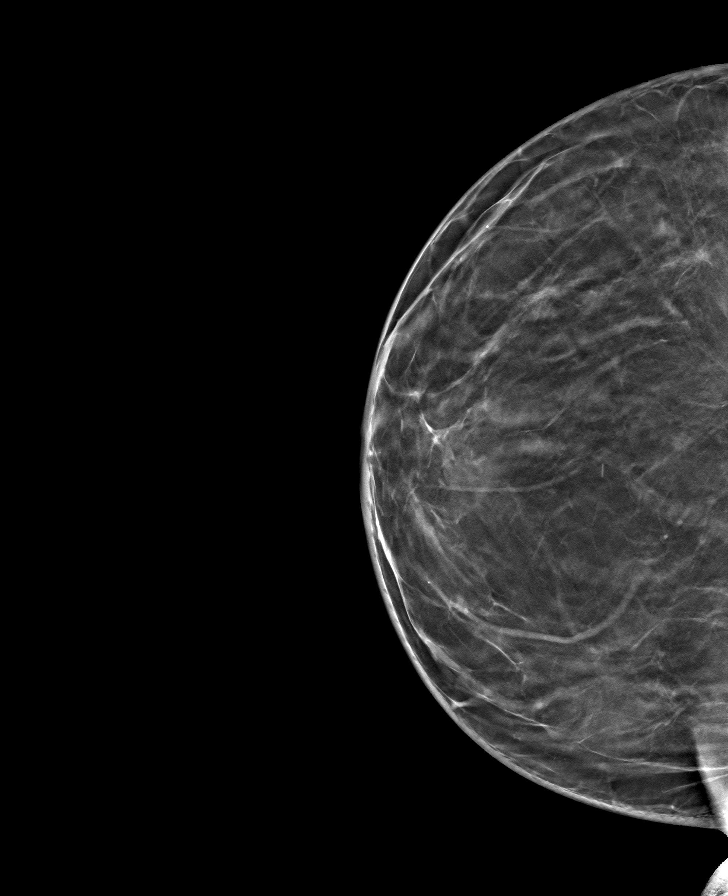

[8 of 24 positions shown; findings below may reference images not displayed]

ACR Breast Density Category b: There are scattered areas of
fibroglandular density.
FINDINGS: There are no findings suspicious for malignancy.
IMPRESSION: No mammographic evidence of malignancy. A result letter of this
screening mammogram will be mailed directly to the patient.

RECOMMENDATION:
Screening mammogram in one year. (Code:[id number]-[id number])

BI-RADS CATEGORY  1: Negative.

## 2022-05-30 LAB — HM DIABETES EYE EXAM

## 2022-06-03 ENCOUNTER — Ambulatory Visit: Payer: Managed Care, Other (non HMO)

## 2022-06-16 ENCOUNTER — Other Ambulatory Visit: Payer: Self-pay

## 2022-06-16 DIAGNOSIS — E1165 Type 2 diabetes mellitus with hyperglycemia: Secondary | ICD-10-CM

## 2022-06-16 MED ORDER — DAPAGLIFLOZIN PROPANEDIOL 10 MG PO TABS: 10.0000 mg | ORAL_TABLET | Freq: Every day | ORAL | 1 refills | Status: DC

## 2022-06-17 ENCOUNTER — Other Ambulatory Visit: Payer: Self-pay

## 2022-06-17 DIAGNOSIS — E1165 Type 2 diabetes mellitus with hyperglycemia: Secondary | ICD-10-CM

## 2022-06-17 MED ORDER — DAPAGLIFLOZIN PROPANEDIOL 10 MG PO TABS: 10.0000 mg | ORAL_TABLET | Freq: Every day | ORAL | 1 refills | Status: DC

## 2022-07-04 ENCOUNTER — Ambulatory Visit: Payer: Managed Care, Other (non HMO)

## 2022-08-05 ENCOUNTER — Encounter: Payer: Self-pay | Admitting: Nurse Practitioner

## 2022-08-05 DIAGNOSIS — E669 Obesity, unspecified: Secondary | ICD-10-CM | POA: Insufficient documentation

## 2022-08-12 ENCOUNTER — Ambulatory Visit
Admission: RE | Admit: 2022-08-12 | Discharge: 2022-08-12 | Disposition: A | Discharge status: Home / Self Care | Payer: Managed Care, Other (non HMO) | Source: Ambulatory Visit | Attending: Nurse Practitioner | Admitting: Nurse Practitioner

## 2022-08-12 DIAGNOSIS — Z139 Encounter for screening, unspecified: Secondary | ICD-10-CM

## 2022-08-14 ENCOUNTER — Encounter: Payer: Self-pay | Admitting: Nurse Practitioner

## 2022-09-03 ENCOUNTER — Encounter: Payer: PRIVATE HEALTH INSURANCE | Admitting: Nurse Practitioner

## 2022-09-12 ENCOUNTER — Encounter: Payer: PRIVATE HEALTH INSURANCE | Admitting: Nurse Practitioner

## 2022-09-23 ENCOUNTER — Other Ambulatory Visit: Payer: Self-pay

## 2022-09-23 ENCOUNTER — Other Ambulatory Visit: Payer: Self-pay | Admitting: Orthopaedic Surgery

## 2022-09-23 DIAGNOSIS — M12819 Other specific arthropathies, not elsewhere classified, unspecified shoulder: Secondary | ICD-10-CM

## 2022-09-23 DIAGNOSIS — E1165 Type 2 diabetes mellitus with hyperglycemia: Secondary | ICD-10-CM

## 2022-09-23 DIAGNOSIS — M5412 Radiculopathy, cervical region: Secondary | ICD-10-CM

## 2022-09-23 MED ORDER — MOUNJARO 7.5 MG/0.5ML ~~LOC~~ SOAJ: 7.5000 mg | SUBCUTANEOUS | 1 refills | Status: DC

## 2022-09-24 ENCOUNTER — Other Ambulatory Visit: Payer: Self-pay

## 2022-09-24 DIAGNOSIS — E782 Mixed hyperlipidemia: Secondary | ICD-10-CM

## 2022-09-24 MED ORDER — PRAVASTATIN SODIUM 40 MG PO TABS: 40.0000 mg | ORAL_TABLET | Freq: Every day | ORAL | 2 refills | Status: DC

## 2022-09-25 ENCOUNTER — Encounter: Payer: Self-pay | Admitting: Orthopaedic Surgery

## 2022-10-04 ENCOUNTER — Ambulatory Visit
Admission: RE | Admit: 2022-10-04 | Discharge: 2022-10-04 | Disposition: A | Discharge status: Home / Self Care | Payer: Managed Care, Other (non HMO) | Source: Ambulatory Visit | Attending: Orthopaedic Surgery | Admitting: Orthopaedic Surgery

## 2022-10-04 DIAGNOSIS — M5412 Radiculopathy, cervical region: Secondary | ICD-10-CM

## 2022-10-04 DIAGNOSIS — M12819 Other specific arthropathies, not elsewhere classified, unspecified shoulder: Secondary | ICD-10-CM

## 2022-10-08 ENCOUNTER — Other Ambulatory Visit: Payer: Self-pay | Admitting: Physician Assistant

## 2022-10-08 DIAGNOSIS — M4800 Spinal stenosis, site unspecified: Secondary | ICD-10-CM

## 2022-10-10 ENCOUNTER — Other Ambulatory Visit: Payer: Managed Care, Other (non HMO)

## 2022-10-13 ENCOUNTER — Other Ambulatory Visit: Payer: Self-pay | Admitting: Nurse Practitioner

## 2022-10-13 DIAGNOSIS — I10 Essential (primary) hypertension: Secondary | ICD-10-CM

## 2022-10-16 ENCOUNTER — Other Ambulatory Visit: Payer: Managed Care, Other (non HMO)

## 2022-10-16 NOTE — Discharge Instructions (Signed)

## 2022-10-17 ENCOUNTER — Ambulatory Visit
Admission: RE | Admit: 2022-10-17 | Discharge: 2022-10-17 | Disposition: A | Discharge status: Home / Self Care | Payer: Managed Care, Other (non HMO) | Source: Ambulatory Visit | Attending: Physician Assistant | Admitting: Physician Assistant

## 2022-10-17 DIAGNOSIS — M4800 Spinal stenosis, site unspecified: Secondary | ICD-10-CM

## 2022-10-17 MED ORDER — IOPAMIDOL (ISOVUE-M 300) INJECTION 61%
1.0000 mL | Freq: Once | INTRAMUSCULAR | Status: AC
  Administered 2022-10-17: 1 mL via EPIDURAL

## 2022-10-17 MED ORDER — TRIAMCINOLONE ACETONIDE 40 MG/ML IJ SUSP (RADIOLOGY)
60.0000 mg | Freq: Once | INTRAMUSCULAR | Status: AC
  Administered 2022-10-17: 60 mg via EPIDURAL

## 2022-10-20 ENCOUNTER — Encounter: Payer: Self-pay | Admitting: Nurse Practitioner

## 2022-10-20 ENCOUNTER — Ambulatory Visit (INDEPENDENT_AMBULATORY_CARE_PROVIDER_SITE_OTHER): Payer: Managed Care, Other (non HMO) | Admitting: Nurse Practitioner

## 2022-10-20 VITALS — BP 130/78 | HR 89 | Temp 98.2°F | Ht 67.0 in | Wt 229.8 lb

## 2022-10-20 DIAGNOSIS — I10 Essential (primary) hypertension: Secondary | ICD-10-CM | POA: Diagnosis not present

## 2022-10-20 DIAGNOSIS — E6609 Other obesity due to excess calories: Secondary | ICD-10-CM

## 2022-10-20 DIAGNOSIS — E1165 Type 2 diabetes mellitus with hyperglycemia: Secondary | ICD-10-CM | POA: Diagnosis not present

## 2022-10-20 DIAGNOSIS — Z2821 Immunization not carried out because of patient refusal: Secondary | ICD-10-CM

## 2022-10-20 DIAGNOSIS — Z0000 Encounter for general adult medical examination without abnormal findings: Secondary | ICD-10-CM | POA: Diagnosis not present

## 2022-10-20 DIAGNOSIS — E782 Mixed hyperlipidemia: Secondary | ICD-10-CM | POA: Diagnosis not present

## 2022-10-20 DIAGNOSIS — Z6835 Body mass index (BMI) 35.0-35.9, adult: Secondary | ICD-10-CM

## 2022-10-20 DIAGNOSIS — Z79899 Other long term (current) drug therapy: Secondary | ICD-10-CM

## 2022-10-20 LAB — HM PAP SMEAR: HPV, high-risk: NEGATIVE

## 2022-10-20 MED ORDER — ACCU-CHEK GUIDE ME W/DEVICE KIT
1.0000 | PACK | Freq: Three times a day (TID) | Status: DC
Start: 2022-10-20 — End: 2022-10-31

## 2022-10-20 MED ORDER — MOUNJARO 10 MG/0.5ML ~~LOC~~ SOAJ
10.0000 mg | SUBCUTANEOUS | 2 refills | Status: DC
Start: 2022-10-20 — End: 2023-04-30

## 2022-10-20 MED ORDER — ACCU-CHEK GUIDE VI STRP
ORAL_STRIP | Status: AC
Start: 2022-10-20 — End: ?

## 2022-10-21 LAB — CBC WITH DIFFERENTIAL/PLATELET
Basophils Absolute: 0 10*3/uL (ref 0.0–0.2)
Basos: 0 %
EOS (ABSOLUTE): 0 10*3/uL (ref 0.0–0.4)
Eos: 0 %
Hematocrit: 42.6 % (ref 34.0–46.6)
Hemoglobin: 13.6 g/dL (ref 11.1–15.9)
Immature Grans (Abs): 0 10*3/uL (ref 0.0–0.1)
Immature Granulocytes: 0 %
Lymphocytes Absolute: 2.7 10*3/uL (ref 0.7–3.1)
Lymphs: 38 %
MCH: 22.4 pg — ABNORMAL LOW (ref 26.6–33.0)
MCHC: 31.9 g/dL (ref 31.5–35.7)
MCV: 70 fL — ABNORMAL LOW (ref 79–97)
Monocytes Absolute: 0.5 10*3/uL (ref 0.1–0.9)
Monocytes: 7 %
Neutrophils Absolute: 3.8 10*3/uL (ref 1.4–7.0)
Neutrophils: 55 %
Platelets: 346 10*3/uL (ref 150–450)
RBC: 6.06 x10E6/uL — ABNORMAL HIGH (ref 3.77–5.28)
RDW: 16.2 % — ABNORMAL HIGH (ref 11.7–15.4)
WBC: 7.2 10*3/uL (ref 3.4–10.8)

## 2022-10-21 LAB — CMP14+EGFR
ALT: 17 IU/L (ref 0–32)
AST: 19 IU/L (ref 0–40)
Albumin: 4.5 g/dL (ref 3.8–4.9)
Alkaline Phosphatase: 73 IU/L (ref 44–121)
BUN/Creatinine Ratio: 21 (ref 9–23)
BUN: 14 mg/dL (ref 6–24)
Bilirubin Total: 0.5 mg/dL (ref 0.0–1.2)
CO2: 24 mmol/L (ref 20–29)
Calcium: 9.6 mg/dL (ref 8.7–10.2)
Chloride: 98 mmol/L (ref 96–106)
Creatinine, Ser: 0.68 mg/dL (ref 0.57–1.00)
Globulin, Total: 2.9 g/dL (ref 1.5–4.5)
Glucose: 118 mg/dL — ABNORMAL HIGH (ref 70–99)
Potassium: 4.2 mmol/L (ref 3.5–5.2)
Sodium: 137 mmol/L (ref 134–144)
Total Protein: 7.4 g/dL (ref 6.0–8.5)
eGFR: 103 mL/min/{1.73_m2} (ref >59)

## 2022-10-21 LAB — LIPID PANEL
Chol/HDL Ratio: 3.4 ratio (ref 0.0–4.4)
Cholesterol, Total: 197 mg/dL (ref 100–199)
HDL: 58 mg/dL (ref >39)
LDL Chol Calc (NIH): 113 mg/dL — ABNORMAL HIGH (ref 0–99)
Triglycerides: 146 mg/dL (ref 0–149)
VLDL Cholesterol Cal: 26 mg/dL (ref 5–40)

## 2022-10-21 LAB — POCT URINALYSIS DIP (CLINITEK)
Bilirubin, UA: NEGATIVE
Blood, UA: NEGATIVE
Glucose, UA: >=1000 mg/dL — AB
Ketones, POC UA: NEGATIVE mg/dL
Nitrite, UA: NEGATIVE
POC PROTEIN,UA: NEGATIVE
Spec Grav, UA: <=1.005 — AB (ref 1.010–1.025)
Urobilinogen, UA: 0.2 E.U./dL
pH, UA: 5 (ref 5.0–8.0)

## 2022-10-21 LAB — HEMOGLOBIN A1C
Est. average glucose Bld gHb Est-mCnc: 217 mg/dL
Hgb A1c MFr Bld: 9.2 % — ABNORMAL HIGH (ref 4.8–5.6)

## 2022-10-21 LAB — MICROALBUMIN / CREATININE URINE RATIO
Creatinine, Urine: 19.6 mg/dL
Microalb/Creat Ratio: <15 mg/g creat (ref 0–29)
Microalbumin, Urine: <3 ug/mL

## 2022-10-23 ENCOUNTER — Encounter: Payer: Self-pay | Admitting: Nurse Practitioner

## 2022-10-29 DIAGNOSIS — E782 Mixed hyperlipidemia: Secondary | ICD-10-CM | POA: Insufficient documentation

## 2022-10-29 DIAGNOSIS — E6609 Other obesity due to excess calories: Secondary | ICD-10-CM | POA: Insufficient documentation

## 2022-10-29 DIAGNOSIS — Z2821 Immunization not carried out because of patient refusal: Secondary | ICD-10-CM | POA: Insufficient documentation

## 2022-10-29 NOTE — Assessment & Plan Note (Signed)
HgbA1c was improving at last visit, has been receiving steroid injections for her back so there is a chance her HgbA1c will be elevated

## 2022-10-29 NOTE — Assessment & Plan Note (Signed)
Has been missing doses, cholesterol levels had increased. Will check panel today

## 2022-10-29 NOTE — Assessment & Plan Note (Signed)

## 2022-10-29 NOTE — Assessment & Plan Note (Signed)
She has lost approximately 20 lbs since her last visit. Continue focusing on healthy diet and exercise.

## 2022-10-29 NOTE — Assessment & Plan Note (Deleted)
Blood pressure is fairly controlled, continue focusing on lifestyle modifications

## 2022-10-29 NOTE — Assessment & Plan Note (Signed)
Declines shingrix, educated on disease process and is aware if he changes his mind to notify office  

## 2022-10-29 NOTE — Assessment & Plan Note (Signed)

## 2022-10-29 NOTE — Assessment & Plan Note (Addendum)
EKG done with Sinus Rhythm -frequent multiform ectopic ventricular beats BORDERLINE RHYTHM HR 89      Blood pressure is fairly controlled, continue focusing on lifestyle modifications

## 2022-10-29 NOTE — Assessment & Plan Note (Signed)
Behavior modifications discussed and diet history reviewed.   Pt will continue to exercise regularly and modify diet with low GI, plant based foods and decrease intake of processed foods.  Recommend intake of daily multivitamin, Vitamin D, and calcium.  Recommend mammogram and colonoscopy for preventive screenings, as well as recommend immunizations that include influenza, TDAP, and Shingles  

## 2022-10-31 ENCOUNTER — Other Ambulatory Visit: Payer: Self-pay

## 2022-10-31 DIAGNOSIS — E1165 Type 2 diabetes mellitus with hyperglycemia: Secondary | ICD-10-CM

## 2022-10-31 MED ORDER — MOUNJARO 12.5 MG/0.5ML ~~LOC~~ SOAJ: 12.5000 mg | SUBCUTANEOUS | 1 refills | Status: DC

## 2022-10-31 MED ORDER — ACCU-CHEK SOFTCLIX LANCETS MISC: 2 refills | Status: AC

## 2022-10-31 MED ORDER — ACCU-CHEK GUIDE ME W/DEVICE KIT: 1.0000 | PACK | Freq: Three times a day (TID) | 1 refills | Status: DC

## 2022-12-11 ENCOUNTER — Other Ambulatory Visit: Payer: Self-pay | Admitting: Nurse Practitioner

## 2022-12-11 DIAGNOSIS — E1165 Type 2 diabetes mellitus with hyperglycemia: Secondary | ICD-10-CM

## 2023-02-19 ENCOUNTER — Ambulatory Visit: Payer: Managed Care, Other (non HMO) | Admitting: Nurse Practitioner

## 2023-02-23 LAB — HM COLONOSCOPY

## 2023-03-12 ENCOUNTER — Ambulatory Visit: Payer: Managed Care, Other (non HMO) | Admitting: Nurse Practitioner

## 2023-03-25 ENCOUNTER — Ambulatory Visit: Payer: Managed Care, Other (non HMO) | Admitting: Nurse Practitioner

## 2023-03-26 ENCOUNTER — Other Ambulatory Visit: Payer: Self-pay | Admitting: Nurse Practitioner

## 2023-03-26 DIAGNOSIS — E1165 Type 2 diabetes mellitus with hyperglycemia: Secondary | ICD-10-CM

## 2023-03-26 MED ORDER — ACCU-CHEK GUIDE ME W/DEVICE KIT: 1.0000 | PACK | Freq: Three times a day (TID) | 1 refills | Status: AC

## 2023-03-26 NOTE — Telephone Encounter (Signed)
 Copied from CRM [id number]. Topic: Clinical - Medication Refill >> Mar 26, 2023  3:25 PM Priscille Loveless wrote: Most Recent Primary Care Visit:  Provider: [name]  Department: [facility]  Visit Type: PHYSICAL  Date: 10/20/2022  Medication: glucose blood (ACCU-CHEK GUIDE) test strip   Has the patient contacted their pharmacy? Yes   Is this the correct pharmacy for this prescription? Yes  This is the patient's preferred pharmacy:   [facility] #[id number] - [place], [place] - [address] AT [facility] OF [address] [place] [address] Phone: [phone] Fax: [phone]  Has the prescription been filled recently? Yes  Is the patient out of the medication? Yes  Has the patient been seen for an appointment in the last year OR does the patient have an upcoming appointment? Yes  Can we respond through MyChart? Yes  Agent: Please be advised that Rx refills may take up to 3 business days. We ask that you follow-up with your pharmacy.  These was called into the pharmacy prior to this request and the insurance would not cover, please get the one that is covered by insurance.

## 2023-04-02 ENCOUNTER — Ambulatory Visit: Payer: Managed Care, Other (non HMO) | Admitting: Nurse Practitioner

## 2023-04-06 ENCOUNTER — Other Ambulatory Visit: Payer: Self-pay

## 2023-04-06 MED ORDER — ACCU-CHEK GUIDE TEST VI STRP: ORAL_STRIP | 3 refills | Status: AC

## 2023-04-09 ENCOUNTER — Other Ambulatory Visit: Payer: Self-pay | Admitting: Nurse Practitioner

## 2023-04-09 DIAGNOSIS — I10 Essential (primary) hypertension: Secondary | ICD-10-CM

## 2023-04-16 ENCOUNTER — Ambulatory Visit: Payer: Managed Care, Other (non HMO) | Admitting: Nurse Practitioner

## 2023-04-30 ENCOUNTER — Ambulatory Visit (INDEPENDENT_AMBULATORY_CARE_PROVIDER_SITE_OTHER): Admitting: Nurse Practitioner

## 2023-04-30 ENCOUNTER — Encounter: Payer: Self-pay | Admitting: Nurse Practitioner

## 2023-04-30 ENCOUNTER — Ambulatory Visit: Payer: Managed Care, Other (non HMO) | Admitting: Nurse Practitioner

## 2023-04-30 VITALS — BP 132/78 | HR 91 | Temp 98.5°F | Ht 67.0 in | Wt 222.8 lb

## 2023-04-30 DIAGNOSIS — Z2821 Immunization not carried out because of patient refusal: Secondary | ICD-10-CM

## 2023-04-30 DIAGNOSIS — E782 Mixed hyperlipidemia: Secondary | ICD-10-CM

## 2023-04-30 DIAGNOSIS — I10 Essential (primary) hypertension: Secondary | ICD-10-CM | POA: Diagnosis not present

## 2023-04-30 DIAGNOSIS — M79675 Pain in left toe(s): Secondary | ICD-10-CM

## 2023-04-30 DIAGNOSIS — M545 Low back pain, unspecified: Secondary | ICD-10-CM | POA: Diagnosis not present

## 2023-04-30 DIAGNOSIS — E1165 Type 2 diabetes mellitus with hyperglycemia: Secondary | ICD-10-CM

## 2023-04-30 DIAGNOSIS — E66811 Obesity, class 1: Secondary | ICD-10-CM

## 2023-04-30 DIAGNOSIS — E6609 Other obesity due to excess calories: Secondary | ICD-10-CM

## 2023-04-30 DIAGNOSIS — E1169 Type 2 diabetes mellitus with other specified complication: Secondary | ICD-10-CM

## 2023-04-30 DIAGNOSIS — Z6834 Body mass index (BMI) 34.0-34.9, adult: Secondary | ICD-10-CM

## 2023-04-30 DIAGNOSIS — Z124 Encounter for screening for malignant neoplasm of cervix: Secondary | ICD-10-CM

## 2023-04-30 NOTE — Progress Notes (Signed)
 [place], CMA,acting as a Neurosurgeon for Kathryn Epley, FNP.,have documented all relevant documentation on the behalf of Kathryn Epley, FNP,as directed by  Kathryn Epley, FNP while in the presence of Kathryn Epley, FNP.  Subjective:  Patient ID: DEMIA Rivera , female    DOB: 1965/05/29 , 58 y.o.   MRN: [id number]  Chief Complaint  Patient presents with   Hypertension    HPI  Patient presents today for a bp and dm follow up, Patient reports compliance with medication. Patient denies any chest pain, SOB, or headaches. Patient reports she hit her left 5th toe last night on a book shelf. She reports she would like a xray for it. Patient also reports she has lower right side back pain that started last Sunday, she went to urgent care and was told she didn't have UTI and was giving muscle relaxer.  She feels that this pain is better, but she feels a continuous ache in her lower back.  She reports it is getting better but still present a little.    Feels that her blood pressure has been well controlled, she has had no issues with headaches, dizziness, or chest pain.    She is now checking her blood sugars in the morning and afternoons  with readings of 190 range  in am,  and 130 range in evening.  She has been following a low carb diet, will not be eating meats, sugars or breads for a month with church group "fasting".  She has not been able exercise as much as she should due to need to have an ortho surgery on her right knee due to an injury in 2018.  She is down 7 lbs from her last visit.    Hypertension This is a chronic problem. The current episode started more than 1 year ago. The problem has been gradually improving since onset. The problem is controlled.  Diabetes She presents for her follow-up diabetic visit. She has type 2 diabetes mellitus. No MedicAlert identification noted. There are no hypoglycemic associated symptoms. There are no hypoglycemic complications. Symptoms are stable. There  are no known risk factors for coronary artery disease. Her weight is decreasing steadily. She is following a diabetic and generally healthy diet. Meal planning includes avoidance of concentrated sweets and carbohydrate counting. She has not had a previous visit with a dietitian. She participates in exercise intermittently. Her home blood glucose trend is decreasing steadily. Her breakfast blood glucose range is generally 180-200 mg/dl. Her dinner blood glucose range is generally 130-140 mg/dl. An ACE inhibitor/angiotensin II receptor blocker is being taken. She does not see a podiatrist.Eye exam is current.     Past Medical History:  Diagnosis Date   Diabetes mellitus without complication (HCC)    Hyperlipidemia    Hypertension      Family History  Problem Relation Age of Onset   Hypertension Mother    Diabetes Mother    Breast cancer Mother    Heart attack Mother    Hypertension Father    Stroke Father    Hypertension Maternal Grandfather    Cancer Maternal Grandfather    Hypertension Paternal Grandmother    Stroke Paternal Grandmother      Current Outpatient Medications:    Accu-Chek Softclix Lancets lancets, Use as instructed to check blood sugars 3 times daily. E11.69, Disp: 100 each, Rfl: 2   Blood Glucose Monitoring Suppl (ACCU-CHEK GUIDE ME) w/Device KIT, 1 each by Does not apply route 4 (four) times daily -  after meals and at bedtime., Disp: 1 kit, Rfl: 1   FARXIGA 10 MG TABS tablet, TAKE 1 TABLET(10 MG) BY MOUTH DAILY BEFORE BREAKFAST, Disp: 90 tablet, Rfl: 1   glucose blood (ACCU-CHEK GUIDE TEST) test strip, Use as directed to check blood sugars three times daily E11.65, Disp: 100 each, Rfl: 3   glucose blood (ACCU-CHEK GUIDE) test strip, Use as instructed, Disp: , Rfl:    lisinopril-hydrochlorothiazide (ZESTORETIC) 20-25 MG tablet, TAKE 1 TABLET BY MOUTH DAILY, Disp: 90 tablet, Rfl: 2   pravastatin (PRAVACHOL) 40 MG tablet, Take 1 tablet (40 mg total) by mouth daily., Disp:  90 tablet, Rfl: 2   tirzepatide (MOUNJARO) 12.5 MG/0.5ML Pen, Inject 12.5 mg into the skin once a week., Disp: 6 mL, Rfl: 1   methocarbamol (ROBAXIN) 500 MG tablet, Take 1 tablet (500 mg total) by mouth 2 (two) times daily as needed. (Patient not taking: Reported on 04/30/2023), Disp: 20 tablet, Rfl: 0   Allergies  Allergen Reactions   Olmesartan Medoxomil-Hctz Other (See Comments)    Cramps.      Review of Systems  Musculoskeletal:  Positive for arthralgias (left 5th metatarsal).  All other systems reviewed and are negative.    Today's Vitals   04/30/23 1153  BP: 132/78  Pulse: 91  Temp: 98.5 F (36.9 C)  TempSrc: Oral  Weight: 222 lb 12.8 oz (101.1 kg)  Height: 5\' 7"  (1.702 m)  PainSc: 6   PainLoc: Toe   Body mass index is 34.9 kg/m.  Wt Readings from Last 3 Encounters:  04/30/23 222 lb 12.8 oz (101.1 kg)  10/20/22 229 lb 12.8 oz (104.2 kg)  04/10/22 252 lb 9.6 oz (114.6 kg)     Objective:  Physical Exam Constitutional:      Appearance: Normal appearance. She is obese.  HENT:     Head: Normocephalic and atraumatic.     Right Ear: External ear normal.     Left Ear: External ear normal.     Nose: Nose normal.     Mouth/Throat:     Mouth: Mucous membranes are moist.     Pharynx: Oropharynx is clear.  Eyes:     Conjunctiva/sclera: Conjunctivae normal.     Pupils: Pupils are equal, round, and reactive to light.  Cardiovascular:     Rate and Rhythm: Normal rate and regular rhythm.     Pulses: Normal pulses.     Heart sounds: Normal heart sounds.  Pulmonary:     Effort: Pulmonary effort is normal.     Breath sounds: Normal breath sounds.  Musculoskeletal:        General: Signs of injury (left 5th metatarsal, trace non pitting edema and bruise) present.     Cervical back: Normal range of motion.  Skin:    General: Skin is warm and dry.     Capillary Refill: Capillary refill takes 2 to 3 seconds.  Neurological:     General: No focal deficit present.     Mental  Status: She is alert and oriented to person, place, and time.         Assessment And Plan:  Uncontrolled type 2 diabetes mellitus with hyperglycemia (HCC) Assessment & Plan: She has been tolerating her medications well and no longer taking insulin, will check Hgba1c  Orders: -     Hemoglobin A1c  Essential hypertension Assessment & Plan: Stable blood pressure, continue current medications  Orders: -     BMP8+eGFR  Mixed hyperlipidemia Assessment & Plan: Cholesterol levels improved  at last visit. Will check panel today  Orders: -     Lipid panel  COVID-19 vaccination declined Assessment & Plan: Declines covid 19 vaccine. Discussed risk of covid 79 and if she changes her mind about the vaccine to call the office. Education has been provided regarding the importance of this vaccine but patient still declined. Advised may receive this vaccine at local pharmacy or Health Dept.or vaccine clinic. Aware to provide a copy of the vaccination record if obtained from local pharmacy or Health Dept.  Encouraged to take multivitamin, vitamin d, vitamin c and zinc to increase immune system. Aware can call office if would like to have vaccine here at office. Verbalized acceptance and understanding.    Acute right-sided low back pain without sciatica  Pain of toe of left foot -     DG Foot Complete Left; Future  Herpes zoster vaccination declined Assessment & Plan: Declines shingrix, educated on disease process and is aware if he changes his mind to notify office    Class 1 obesity due to excess calories without serious comorbidity with body mass index (BMI) of 34.0 to 34.9 in adult Assessment & Plan: Weight has improved slightly, She is encouraged to strive for BMI less than 30 to decrease cardiac risk. Advised to aim for at least 150 minutes of exercise per week.      Return for keep same next..  Patient was given opportunity to ask questions. Patient verbalized understanding of  the plan and was able to repeat key elements of the plan. All questions were answered to their satisfaction.   I have reviewed this encounter including the documentation in this note and/or discussed this patient with Mickael Alamo Nurse Practitioner Student. I am certifying that I agree with the content of this note as the primary care nurse practitioner.  Kathryn Epley, DNP, FNP-BC  I, Kathryn Epley, FNP, have reviewed all documentation for this visit. The documentation on 04/30/23 for the exam, diagnosis, procedures, and orders are all accurate and complete.   IF YOU HAVE BEEN REFERRED TO A SPECIALIST, IT MAY TAKE 1-2 WEEKS TO SCHEDULE/PROCESS THE REFERRAL. IF YOU HAVE NOT HEARD FROM US /SPECIALIST IN TWO WEEKS, PLEASE GIVE US  A CALL AT [phone] X [phone].

## 2023-04-30 NOTE — Assessment & Plan Note (Addendum)
 Stable blood pressure, continue current medications

## 2023-04-30 NOTE — Progress Notes (Deleted)
 Madelaine Bhat, CMA,acting as a Neurosurgeon for [name], FNP.,have documented all relevant documentation on the behalf of [name], FNP,as directed by  [name], FNP while in the presence of [name], FNP.  Subjective:  Patient ID: Kathryn Rivera , female    DOB: Dec 16, 1965 , 58 y.o.   MRN: [id number]  No chief complaint on file.   HPI  Patient presents today for a bp and dm follow up, Patient reports compliance with medication. Patient denies any chest pain, SOB, or headaches. Patient has no concerns today.     Past Medical History:  Diagnosis Date  . Diabetes mellitus without complication (HCC)   . Hyperlipidemia   . Hypertension      Family History  Problem Relation Age of Onset  . Hypertension Mother   . Diabetes Mother   . Breast cancer Mother   . Heart attack Mother   . Hypertension Father   . Stroke Father   . Hypertension Maternal Grandfather   . Cancer Maternal Grandfather   . Hypertension Paternal Grandmother   . Stroke Paternal Grandmother      Current Outpatient Medications:  .  Accu-Chek Softclix Lancets lancets, Use as instructed to check blood sugars 3 times daily. E11.69, Disp: 100 each, Rfl: 2 .  tirzepatide (MOUNJARO) 12.5 MG/0.5ML Pen, Inject 12.5 mg into the skin once a week., Disp: 6 mL, Rfl: 1 .  Blood Glucose Monitoring Suppl (ACCU-CHEK GUIDE ME) w/Device KIT, 1 each by Does not apply route 4 (four) times daily - after meals and at bedtime., Disp: 1 kit, Rfl: 1 .  FARXIGA 10 MG TABS tablet, TAKE 1 TABLET(10 MG) BY MOUTH DAILY BEFORE BREAKFAST, Disp: 90 tablet, Rfl: 1 .  glucose blood (ACCU-CHEK GUIDE TEST) test strip, Use as directed to check blood sugars three times daily E11.65, Disp: 100 each, Rfl: 3 .  glucose blood (ACCU-CHEK GUIDE) test strip, Use as instructed, Disp: , Rfl:  .  lisinopril-hydrochlorothiazide (ZESTORETIC) 20-25 MG tablet, TAKE 1 TABLET BY MOUTH DAILY, Disp: 90 tablet, Rfl: 2 .  methocarbamol (ROBAXIN) 500 MG tablet,  Take 1 tablet (500 mg total) by mouth 2 (two) times daily as needed. (Patient not taking: Reported on 10/20/2022), Disp: 20 tablet, Rfl: 0 .  pravastatin (PRAVACHOL) 40 MG tablet, Take 1 tablet (40 mg total) by mouth daily., Disp: 90 tablet, Rfl: 2 .  tirzepatide (MOUNJARO) 10 MG/0.5ML Pen, Inject 10 mg into the skin once a week., Disp: 2 mL, Rfl: 2   Allergies  Allergen Reactions  . Olmesartan Medoxomil-Hctz Other (See Comments)    Cramps.      Review of Systems   There were no vitals filed for this visit. There is no height or weight on file to calculate BMI.  Wt Readings from Last 3 Encounters:  10/20/22 229 lb 12.8 oz (104.2 kg)  04/10/22 252 lb 9.6 oz (114.6 kg)  12/10/21 234 lb (106.1 kg)    The 10-year ASCVD risk score (Arnett DK, et al., 2019) is: 11.3%   Values used to calculate the score:     Age: 74 years     Sex: Female     Is Non-Hispanic African American: Yes     Diabetic: Yes     Tobacco smoker: No     Systolic Blood Pressure: 130 mmHg     Is BP treated: Yes     HDL Cholesterol: 58 mg/dL     Total Cholesterol: 197 mg/dL  Objective:  Physical Exam  Assessment And Plan:  Uncontrolled type 2 diabetes mellitus with hyperglycemia (HCC)  Essential hypertension  Mixed hyperlipidemia    No follow-ups on file.  Patient was given opportunity to ask questions. Patient verbalized understanding of the plan and was able to repeat key elements of the plan. All questions were answered to their satisfaction.    Jeanell Sparrow, FNP, have reviewed all documentation for this visit. The documentation on 04/30/23 for the exam, diagnosis, procedures, and orders are all accurate and complete.   IF YOU HAVE BEEN REFERRED TO A SPECIALIST, IT MAY TAKE 1-2 WEEKS TO SCHEDULE/PROCESS THE REFERRAL. IF YOU HAVE NOT HEARD FROM US/SPECIALIST IN TWO WEEKS, PLEASE GIVE [place] A CALL AT [phone] X [phone].

## 2023-04-30 NOTE — Assessment & Plan Note (Signed)
 Continue farxiga and mounjaro has prescribed.  Patient is having good results and tolerating these medications well

## 2023-05-01 LAB — BMP8+EGFR
BUN/Creatinine Ratio: 17 (ref 9–23)
BUN: 11 mg/dL (ref 6–24)
CO2: 25 mmol/L (ref 20–29)
Calcium: 9.6 mg/dL (ref 8.7–10.2)
Chloride: 99 mmol/L (ref 96–106)
Creatinine, Ser: 0.63 mg/dL (ref 0.57–1.00)
Glucose: 90 mg/dL (ref 70–99)
Potassium: 4.4 mmol/L (ref 3.5–5.2)
Sodium: 139 mmol/L (ref 134–144)
eGFR: 105 mL/min/{1.73_m2} (ref >59)

## 2023-05-01 LAB — HEMOGLOBIN A1C
Est. average glucose Bld gHb Est-mCnc: 180 mg/dL
Hgb A1c MFr Bld: 7.9 % — ABNORMAL HIGH (ref 4.8–5.6)

## 2023-05-01 LAB — LIPID PANEL
Chol/HDL Ratio: 3.4 ratio (ref 0.0–4.4)
Cholesterol, Total: 179 mg/dL (ref 100–199)
HDL: 53 mg/dL (ref >39)
LDL Chol Calc (NIH): 107 mg/dL — ABNORMAL HIGH (ref 0–99)
Triglycerides: 105 mg/dL (ref 0–149)
VLDL Cholesterol Cal: 19 mg/dL (ref 5–40)

## 2023-05-06 ENCOUNTER — Ambulatory Visit
Admission: RE | Admit: 2023-05-06 | Discharge: 2023-05-06 | Disposition: A | Discharge status: Home / Self Care | Source: Ambulatory Visit | Attending: Nurse Practitioner | Admitting: Nurse Practitioner

## 2023-05-06 DIAGNOSIS — M79675 Pain in left toe(s): Secondary | ICD-10-CM

## 2023-05-07 ENCOUNTER — Other Ambulatory Visit: Payer: Self-pay | Admitting: Sports Medicine

## 2023-05-07 DIAGNOSIS — S92505D Nondisplaced unspecified fracture of left lesser toe(s), subsequent encounter for fracture with routine healing: Secondary | ICD-10-CM

## 2023-05-07 NOTE — Progress Notes (Signed)
 Patient seen at [facility] and has a non-displaced fracture of a fused left 5th middle and distal phalanx as well as minimally displaced avulsion fracture of the left 5th proximal phalanx.  Standing orders placed for future follow-up x-rays in 3-4 weeks.

## 2023-05-11 ENCOUNTER — Encounter: Payer: Self-pay | Admitting: Nurse Practitioner

## 2023-05-11 DIAGNOSIS — E6609 Other obesity due to excess calories: Secondary | ICD-10-CM | POA: Insufficient documentation

## 2023-05-11 DIAGNOSIS — M79675 Pain in left toe(s): Secondary | ICD-10-CM | POA: Insufficient documentation

## 2023-05-11 DIAGNOSIS — M545 Low back pain, unspecified: Secondary | ICD-10-CM | POA: Insufficient documentation

## 2023-05-11 DIAGNOSIS — Z124 Encounter for screening for malignant neoplasm of cervix: Secondary | ICD-10-CM | POA: Insufficient documentation

## 2023-05-11 NOTE — Assessment & Plan Note (Signed)
 Weight has improved slightly, She is encouraged to strive for BMI less than 30 to decrease cardiac risk. Advised to aim for at least 150 minutes of exercise per week.

## 2023-05-11 NOTE — Assessment & Plan Note (Signed)
 Will check xray has tenderness to base of toe. Able to bend and move. Will check Xray for fracture

## 2023-05-11 NOTE — Assessment & Plan Note (Signed)
 Cholesterol levels improved at last visit. Will check panel today

## 2023-05-11 NOTE — Assessment & Plan Note (Signed)
 She has been tolerating her medications well and no longer taking insulin, will check Hgba1c

## 2023-05-11 NOTE — Assessment & Plan Note (Signed)
 Declines shingrix, educated on disease process and is aware if he changes his mind to notify office

## 2023-05-11 NOTE — Assessment & Plan Note (Signed)

## 2023-05-18 ENCOUNTER — Other Ambulatory Visit: Payer: Self-pay | Admitting: Nurse Practitioner

## 2023-05-18 MED ORDER — MOUNJARO 12.5 MG/0.5ML ~~LOC~~ SOAJ: 12.5000 mg | SUBCUTANEOUS | 1 refills | Status: DC

## 2023-05-18 NOTE — Telephone Encounter (Signed)
 Copied from CRM [id number]. Topic: Clinical - Medication Refill >> May 18, 2023 10:49 AM Arminda Landmark wrote: Most Recent Primary Care Visit:  Provider: [name]  Department: [facility]  Visit Type: OFFICE VISIT  Date: 04/30/2023  Medication: tirzepatide  (MOUNJARO ) 12.5 MG/0.5ML Pen  Has the patient contacted their pharmacy? Yes (Agent: If no, request that the patient contact the pharmacy for the refill. If patient does not wish to contact the pharmacy document the reason why and proceed with request.) (Agent: If yes, when and what did the pharmacy advise?)  Is this the correct pharmacy for this prescription? Yes If no, delete pharmacy and type the correct one.  This is the patient's preferred pharmacy:  [facility] #[id number] - [place], [place] - [address] AT [facility] OF [address] [place] [address] Phone: [phone] Fax: [phone] [facility] DR[facility] STORE #[id number] [place], [place] - [address] AT [facility] OF [address] [place] [address] Phone: [phone] Fax: [phone] Hours: Open 24 hours   Has the prescription been filled recently? Yes  Is the patient out of the medication? Yes  Has the patient been seen for an appointment in the last year OR does the patient have an upcoming appointment? Yes  Can we respond through MyChart? Yes  Agent: Please be advised that Rx refills may take up to 3 business days. We ask that you follow-up with your pharmacy.

## 2023-05-21 ENCOUNTER — Ambulatory Visit
Admission: RE | Admit: 2023-05-21 | Discharge: 2023-05-21 | Disposition: A | Discharge status: Home / Self Care | Source: Ambulatory Visit | Attending: Sports Medicine | Admitting: Sports Medicine

## 2023-05-21 DIAGNOSIS — S92505D Nondisplaced unspecified fracture of left lesser toe(s), subsequent encounter for fracture with routine healing: Secondary | ICD-10-CM

## 2023-06-26 ENCOUNTER — Other Ambulatory Visit: Payer: Self-pay | Admitting: Nurse Practitioner

## 2023-06-26 DIAGNOSIS — E782 Mixed hyperlipidemia: Secondary | ICD-10-CM

## 2023-07-16 ENCOUNTER — Other Ambulatory Visit: Payer: Self-pay | Admitting: Nurse Practitioner

## 2023-07-16 DIAGNOSIS — E1165 Type 2 diabetes mellitus with hyperglycemia: Secondary | ICD-10-CM

## 2023-07-16 NOTE — Telephone Encounter (Signed)
 Copied from CRM [id number]. Topic: Clinical - Medication Refill >> Jul 16, 2023  4:41 PM Leory Rands wrote: Medication: FARXIGA  10 MG TABS tablet [[id number]]  Has the patient contacted their pharmacy? Yes (Agent: If no, request that the patient contact the pharmacy for the refill. If patient does not wish to contact the pharmacy document the reason why and proceed with request.) (Agent: If yes, when and what did the pharmacy advise?)  This is the patient's preferred pharmacy:  [facility] #[id number] - [place], [place] - [address] AT [facility] OF [address] [place] [address] Phone: [phone] Fax: [phone] Hours: Open 24 hours      Is this the correct pharmacy for this prescription? Yes If no, delete pharmacy and type the correct one.   Has the prescription been filled recently? Yes  Is the patient out of the medication? Yes for 3 days  Has the patient been seen for an appointment in the last year OR does the patient have an upcoming appointment? Yes  Can we respond through MyChart? Yes  Agent: Please be advised that Rx refills may take up to 3 business days. We ask that you follow-up with your pharmacy.

## 2023-07-21 NOTE — Telephone Encounter (Signed)
 If she has not been taking then no, but it looks like it is still current on her medication list.

## 2023-07-24 MED ORDER — DAPAGLIFLOZIN PROPANEDIOL 10 MG PO TABS: 10.0000 mg | ORAL_TABLET | Freq: Every day | ORAL | 1 refills | Status: AC

## 2023-08-31 ENCOUNTER — Other Ambulatory Visit: Payer: Self-pay | Admitting: Nurse Practitioner

## 2023-08-31 DIAGNOSIS — Z1231 Encounter for screening mammogram for malignant neoplasm of breast: Secondary | ICD-10-CM

## 2023-09-24 ENCOUNTER — Ambulatory Visit

## 2023-10-14 ENCOUNTER — Ambulatory Visit
Admission: RE | Admit: 2023-10-14 | Discharge: 2023-10-14 | Disposition: A | Discharge status: Home / Self Care | Source: Ambulatory Visit | Attending: Nurse Practitioner | Admitting: Nurse Practitioner

## 2023-10-14 DIAGNOSIS — Z1231 Encounter for screening mammogram for malignant neoplasm of breast: Secondary | ICD-10-CM

## 2023-10-21 ENCOUNTER — Other Ambulatory Visit: Payer: Self-pay | Admitting: Nurse Practitioner

## 2023-10-21 ENCOUNTER — Encounter: Payer: Managed Care, Other (non HMO) | Admitting: Nurse Practitioner

## 2023-10-21 MED ORDER — MOUNJARO 12.5 MG/0.5ML ~~LOC~~ SOAJ: 12.5000 mg | SUBCUTANEOUS | 1 refills | Status: AC

## 2023-10-21 NOTE — Telephone Encounter (Signed)
 Copied from CRM #[id number]. Topic: Clinical - Medication Refill >> Oct 21, 2023  3:09 PM Everette C wrote: Medication: tirzepatide  (MOUNJARO ) 12.5 MG/0.5ML Pen [[id number]]  Has the patient contacted their pharmacy? Yes (Agent: If no, request that the patient contact the pharmacy for the refill. If patient does not wish to contact the pharmacy document the reason why and proceed with request.) (Agent: If yes, when and what did the pharmacy advise?)  This is the patient's preferred pharmacy:  [facility] #[id number] - [place], [place] - [address] AT [facility] OF [address] [place] [address] Phone: [phone] Fax: [phone]  Is this the correct pharmacy for this prescription? Yes If no, delete pharmacy and type the correct one.   Has the prescription been filled recently? Yes  Is the patient out of the medication? Yes  Has the patient been seen for an appointment in the last year OR does the patient have an upcoming appointment? Yes  Can we respond through MyChart? No  Agent: Please be advised that Rx refills may take up to 3 business days. We ask that you follow-up with your pharmacy.

## 2023-10-22 ENCOUNTER — Other Ambulatory Visit: Payer: Self-pay | Admitting: Nurse Practitioner

## 2023-10-22 DIAGNOSIS — I10 Essential (primary) hypertension: Secondary | ICD-10-CM

## 2023-11-10 ENCOUNTER — Ambulatory Visit: Payer: Self-pay | Admitting: Nurse Practitioner

## 2023-11-30 ENCOUNTER — Other Ambulatory Visit (HOSPITAL_COMMUNITY): Payer: Self-pay

## 2023-11-30 MED ORDER — MOUNJARO 15 MG/0.5ML ~~LOC~~ SOAJ
15.0000 mg | SUBCUTANEOUS | 6 refills | Status: AC
  Filled 2023-11-30: qty 2, 28d supply, fill #0
  Filled 2023-12-25: qty 2, 28d supply, fill #1
  Filled 2024-01-26: qty 2, 28d supply, fill #2
  Filled 2024-02-29: qty 2, 28d supply, fill #3

## 2023-11-30 MED ORDER — LOSARTAN POTASSIUM 50 MG PO TABS
50.0000 mg | ORAL_TABLET | Freq: Every day | ORAL | 2 refills | Status: AC
  Filled 2023-11-30: qty 90, 90d supply, fill #0
  Filled 2024-02-29: qty 90, 90d supply, fill #1

## 2023-11-30 MED ORDER — HYDROCHLOROTHIAZIDE 25 MG PO TABS
25.0000 mg | ORAL_TABLET | Freq: Every day | ORAL | 2 refills | Status: AC
  Filled 2023-11-30: qty 90, 90d supply, fill #0
  Filled 2024-02-29: qty 90, 90d supply, fill #1

## 2023-11-30 MED ORDER — DAPAGLIFLOZIN PROPANEDIOL 10 MG PO TABS
10.0000 mg | ORAL_TABLET | Freq: Every day | ORAL | 6 refills | Status: AC
  Filled 2023-11-30 – 2024-02-29 (×3): qty 30, 30d supply, fill #0

## 2024-01-26 ENCOUNTER — Other Ambulatory Visit (HOSPITAL_COMMUNITY): Payer: Self-pay

## 2024-02-02 ENCOUNTER — Other Ambulatory Visit (HOSPITAL_COMMUNITY): Payer: Self-pay

## 2024-02-02 MED ORDER — PRAVASTATIN SODIUM 40 MG PO TABS
40.0000 mg | ORAL_TABLET | Freq: Every day | ORAL | 6 refills | Status: AC
  Filled 2024-02-02 – 2024-02-05 (×2): qty 30, 30d supply, fill #0

## 2024-02-05 ENCOUNTER — Other Ambulatory Visit (HOSPITAL_COMMUNITY): Payer: Self-pay

## 2024-02-08 ENCOUNTER — Other Ambulatory Visit (HOSPITAL_COMMUNITY): Payer: Self-pay

## 2024-02-08 MED ORDER — PRAVASTATIN SODIUM 80 MG PO TABS
80.0000 mg | ORAL_TABLET | Freq: Every evening | ORAL | 2 refills | Status: AC
  Filled 2024-02-08: qty 90, 90d supply, fill #0

## 2024-02-11 ENCOUNTER — Other Ambulatory Visit (HOSPITAL_COMMUNITY): Payer: Self-pay

## 2024-02-24 ENCOUNTER — Encounter: Admitting: Nurse Practitioner

## 2024-02-29 ENCOUNTER — Other Ambulatory Visit (HOSPITAL_COMMUNITY): Payer: Self-pay

## 2024-02-29 MED ORDER — SENNA-DOCUSATE SODIUM 8.6-50 MG PO TABS
1.0000 | ORAL_TABLET | Freq: Two times a day (BID) | ORAL | 0 refills | Status: AC
  Filled 2024-02-29: qty 14, 7d supply, fill #0

## 2024-02-29 MED ORDER — HYDROCODONE-ACETAMINOPHEN 10-325 MG PO TABS
1.0000 | ORAL_TABLET | Freq: Four times a day (QID) | ORAL | 0 refills | Status: AC | PRN
  Filled 2024-02-29: qty 28, 7d supply, fill #0

## 2024-02-29 MED ORDER — POLYETHYLENE GLYCOL 3350 17 G PO PACK
1.0000 | PACK | Freq: Every day | ORAL | 0 refills | Status: AC
  Filled 2024-02-29: qty 7, 7d supply, fill #0

## 2024-02-29 MED ORDER — ASPIRIN 81 MG PO CHEW
81.0000 mg | CHEWABLE_TABLET | Freq: Two times a day (BID) | ORAL | 0 refills | Status: AC
  Filled 2024-02-29: qty 60, 30d supply, fill #0

## 2024-02-29 MED ORDER — CELECOXIB 100 MG PO CAPS
100.0000 mg | ORAL_CAPSULE | Freq: Two times a day (BID) | ORAL | 0 refills | Status: AC
  Filled 2024-02-29: qty 28, 14d supply, fill #0

## 2024-03-01 ENCOUNTER — Other Ambulatory Visit (HOSPITAL_COMMUNITY): Payer: Self-pay

## 2024-03-01 ENCOUNTER — Other Ambulatory Visit: Payer: Self-pay

## 2024-03-03 ENCOUNTER — Other Ambulatory Visit (HOSPITAL_COMMUNITY): Payer: Self-pay

## 2024-03-04 ENCOUNTER — Other Ambulatory Visit: Payer: Self-pay

## 2024-03-04 ENCOUNTER — Telehealth: Payer: Self-pay

## 2024-03-04 NOTE — Telephone Encounter (Signed)
 Called patient to sch her for appt due to refill of farixga needed. Patient stated she is no longer patient here she has moved.
